# Patient Record
Sex: Female | Born: 1990 | Race: Black or African American | Hispanic: No | Marital: Single | Smoking: Current every day smoker
Health system: Southern US, Community
[De-identification: ages and names within clinical notes are randomized; demographics above are authoritative.]

## PROBLEM LIST (undated history)

## (undated) DIAGNOSIS — Z789 Other specified health status: Secondary | ICD-10-CM

## (undated) HISTORY — PX: CHOLECYSTECTOMY: SHX55

## (undated) HISTORY — DX: Other specified health status: Z78.9

---
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
## 2012-07-16 ENCOUNTER — Emergency Department: Payer: Self-pay | Admitting: Emergency Medicine

## 2012-07-18 ENCOUNTER — Emergency Department: Payer: Self-pay | Admitting: Emergency Medicine

## 2013-02-27 LAB — HM HIV SCREENING LAB: HM HIV Screening: NEGATIVE

## 2014-01-19 ENCOUNTER — Emergency Department: Payer: Self-pay | Admitting: Emergency Medicine

## 2014-01-19 LAB — URINALYSIS, COMPLETE
BACTERIA: NONE SEEN
BILIRUBIN, UR: NEGATIVE
BLOOD: NEGATIVE
GLUCOSE, UR: NEGATIVE mg/dL (ref 0–75)
Leukocyte Esterase: NEGATIVE
NITRITE: NEGATIVE
Ph: 5 (ref 4.5–8.0)
Specific Gravity: 1.024 (ref 1.003–1.030)
WBC UR: 10 /HPF (ref 0–5)

## 2014-06-27 ENCOUNTER — Emergency Department: Payer: Self-pay | Admitting: Emergency Medicine

## 2014-06-27 LAB — COMPREHENSIVE METABOLIC PANEL
ALBUMIN: 3.9 g/dL (ref 3.4–5.0)
ALT: 13 U/L — AB
ANION GAP: 12 (ref 7–16)
AST: 20 U/L (ref 15–37)
Alkaline Phosphatase: 50 U/L
BUN: 10 mg/dL (ref 7–18)
Bilirubin,Total: 1.4 mg/dL — ABNORMAL HIGH (ref 0.2–1.0)
CALCIUM: 9.3 mg/dL (ref 8.5–10.1)
CHLORIDE: 101 mmol/L (ref 98–107)
CO2: 23 mmol/L (ref 21–32)
CREATININE: 0.39 mg/dL — AB (ref 0.60–1.30)
EGFR (African American): >60
EGFR (Non-African Amer.): >60
Glucose: 72 mg/dL (ref 65–99)
Osmolality: 270 (ref 275–301)
Potassium: 3.6 mmol/L (ref 3.5–5.1)
SODIUM: 136 mmol/L (ref 136–145)
Total Protein: 7.5 g/dL (ref 6.4–8.2)

## 2014-06-27 LAB — URINALYSIS, COMPLETE
Bacteria: NONE SEEN
Bilirubin,UR: NEGATIVE
Blood: NEGATIVE
Glucose,UR: NEGATIVE mg/dL (ref 0–75)
Leukocyte Esterase: NEGATIVE
NITRITE: NEGATIVE
Ph: 5 (ref 4.5–8.0)
Protein: 100
RBC,UR: 1 /HPF (ref 0–5)
Specific Gravity: 1.026 (ref 1.003–1.030)
Squamous Epithelial: 16
WBC UR: 6 /HPF (ref 0–5)

## 2014-06-27 LAB — LIPASE, BLOOD: Lipase: 63 U/L — ABNORMAL LOW (ref 73–393)

## 2014-06-27 LAB — CBC
HCT: 39 % (ref 35.0–47.0)
HGB: 13.2 g/dL (ref 12.0–16.0)
MCH: 29 pg (ref 26.0–34.0)
MCHC: 33.8 g/dL (ref 32.0–36.0)
MCV: 86 fL (ref 80–100)
Platelet: 169 10*3/uL (ref 150–440)
RBC: 4.54 10*6/uL (ref 3.80–5.20)
RDW: 12.9 % (ref 11.5–14.5)
WBC: 7.2 10*3/uL (ref 3.6–11.0)

## 2014-06-27 LAB — HCG, QUANTITATIVE, PREGNANCY: Beta Hcg, Quant.: >200000 m[IU]/mL — ABNORMAL HIGH

## 2014-06-27 LAB — WET PREP, GENITAL

## 2014-06-28 LAB — GC/CHLAMYDIA PROBE AMP

## 2014-07-15 LAB — HM PAP SMEAR: HM PAP: NEGATIVE

## 2014-12-23 ENCOUNTER — Encounter: Payer: Self-pay | Admitting: Obstetrics and Gynecology

## 2014-12-30 ENCOUNTER — Encounter: Payer: Self-pay | Admitting: Obstetrics & Gynecology

## 2015-01-06 ENCOUNTER — Encounter: Payer: Self-pay | Admitting: Obstetrics and Gynecology

## 2015-01-13 ENCOUNTER — Encounter: Payer: Self-pay | Admitting: Obstetrics & Gynecology

## 2015-01-14 ENCOUNTER — Inpatient Hospital Stay: Payer: Self-pay

## 2015-04-01 NOTE — H&P (Signed)
L&D Evaluation:  History:  HPI 23yo SBF admitted for IOL secondary to IUGR at 37w, G1P0000, pregnancy complicated by smoking with IUGR diagnosed at 30 weeks, followed by [facility] and or office. last scan EFW <3% at 4#15oz with adequate AFI.   Patient's Medical History No Chronic Illness   Patient's Surgical History none   Medications Pre Natal Vitamins  Tylenol (Acetaminophen)   Allergies NKDA   Social History tobacco   Family History Non-Contributory   ROS:  ROS All systems were reviewed.  HEENT, CNS, GI, GU, Respiratory, CV, Renal and Musculoskeletal systems were found to be normal.   Exam:  Vital Signs stable   General no apparent distress   Mental Status clear   Chest clear   Heart normal sinus rhythm   Abdomen gravid, tender with contractions, rates a 7 on pain scale   Estimated Fetal Weight Small for gestational age   Fetal Position vtx   Edema no edema   Mebranes Intact   FHT normal rate with no decels   Fetal Heart Rate 140   Ucx regular   Ucx Frequency 2 min   Length of each Contraction 50 seconds   Ucx Pain Scale 7   Skin dry   Impression:  Impression IUGR at 37w, IOL   Plan:  Plan EFM/NST, monitor contractions and for cervical change, antibiotics for GBBS prophylaxis, Foley bulb placed and pitocin augmentation planned   Comments 3 doses of cytotec placed per protocol through the night, patient tolerated well, desires epidural when possible; Dr Valentino Saxonherry aware of admission and plan of care, in aggreement   Electronic Signatures: Ulyses AmorBurr, Leahmarie Gasiorowski N (CNM)  (Signed 24-Feb-16 08:02)  Authored: L&D Evaluation   Last Updated: 24-Feb-16 08:02 by Ulyses AmorBurr, Nirvan Laban N (CNM)

## 2015-06-08 IMAGING — US US OB < 14 WEEKS - US OB TV
1 series · 14 of 28 positions shown · non-contrast
Comparison: None.

CLINICAL DATA: Pregnant with pelvic pain

EXAM:
OBSTETRIC <14 WK US AND TRANSVAGINAL OB US
TECHNIQUE: Both transabdominal and transvaginal ultrasound examinations were
performed for complete evaluation of the gestation as well as the
maternal uterus, adnexal regions, and pelvic cul-de-sac.
Transvaginal technique was performed to assess early pregnancy.

[Series 1: us ob < 14 weeks - us ob tv · 0.22mm/px · 14 of 50 slices shown]
[im 2/50]
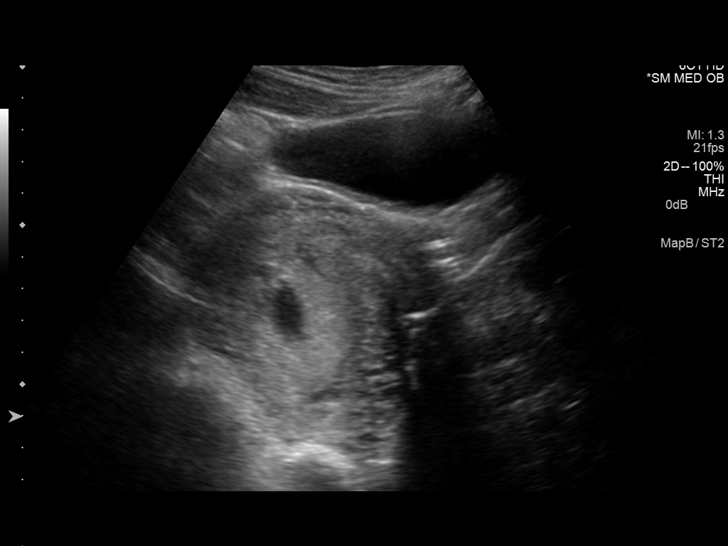
[im 6/50]
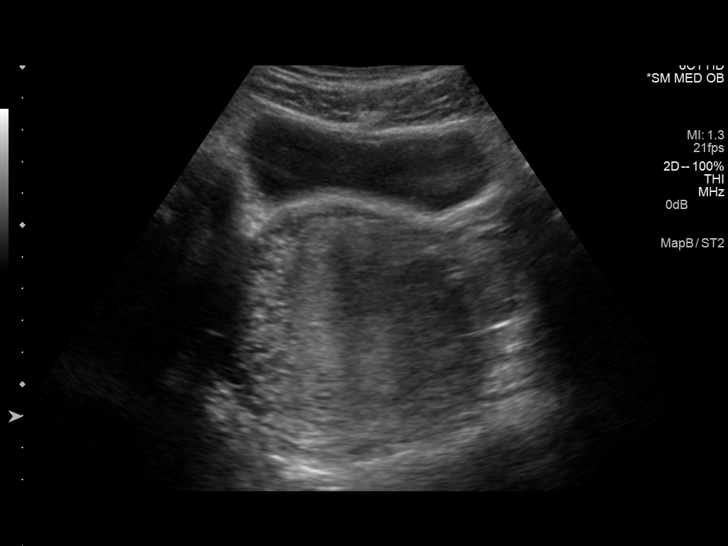
[im 10/50]
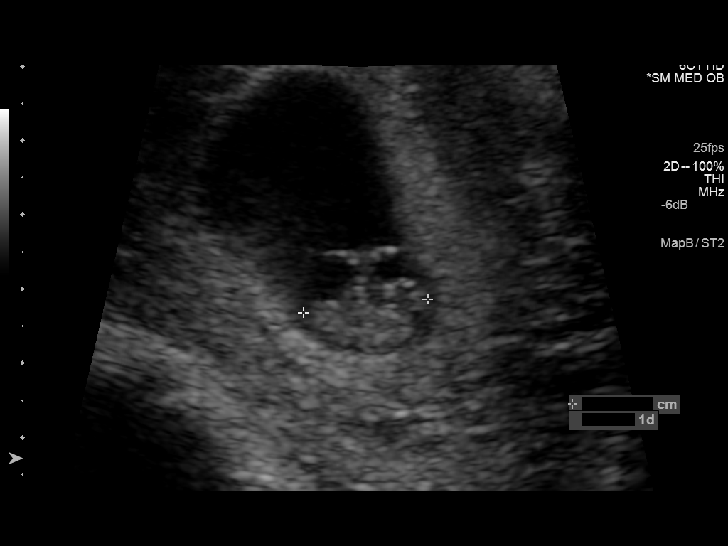
[im 13/50]
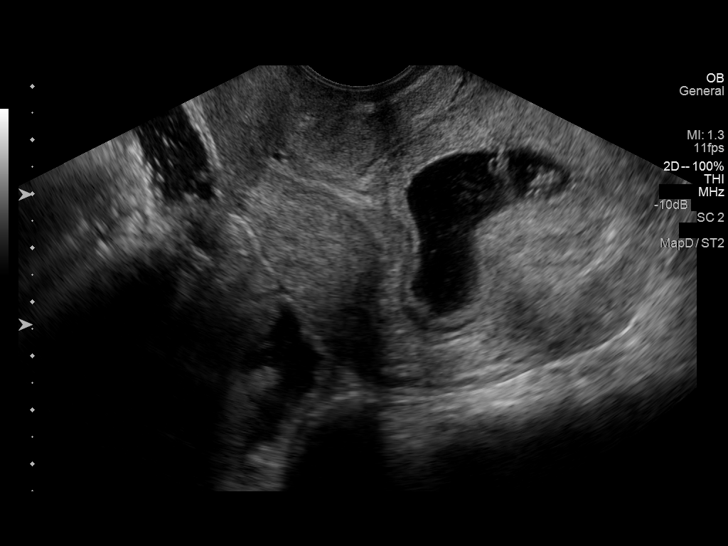
[im 17/50]
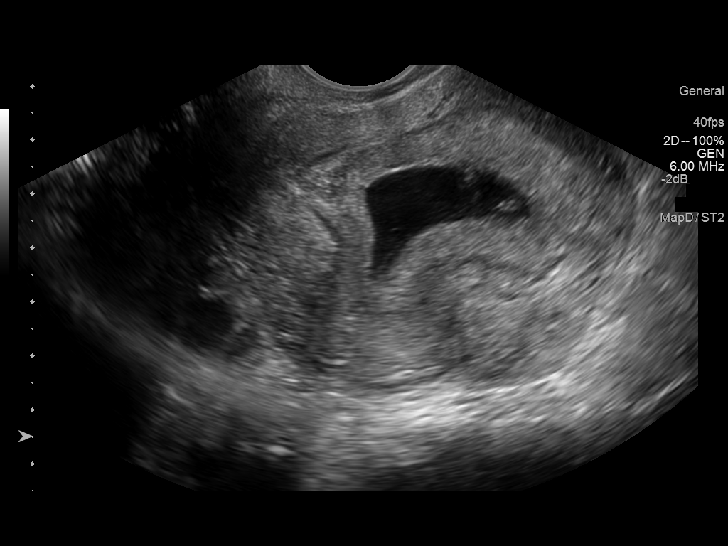
[im 20/50]
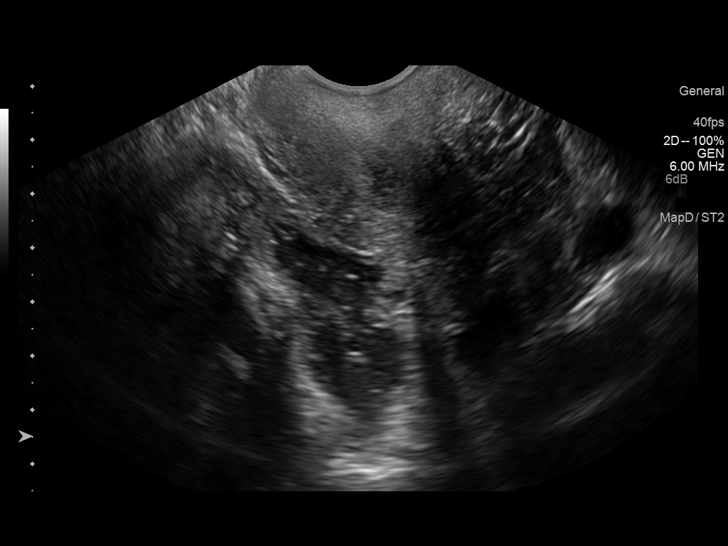
[im 24/50]
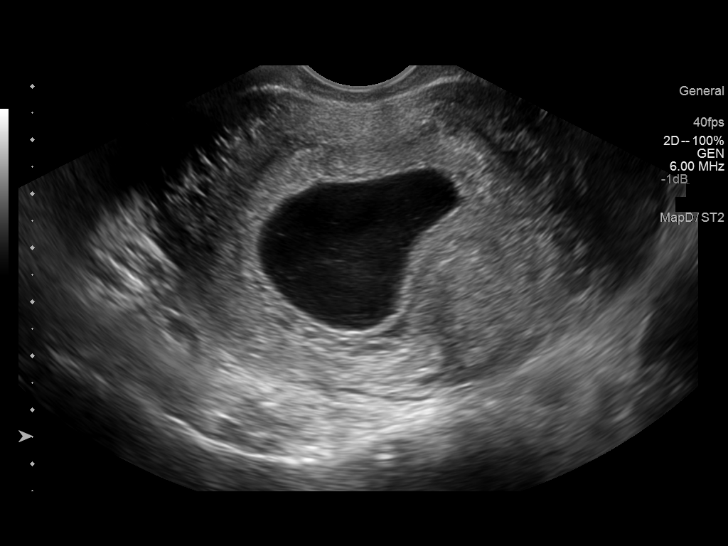
[im 28/50]
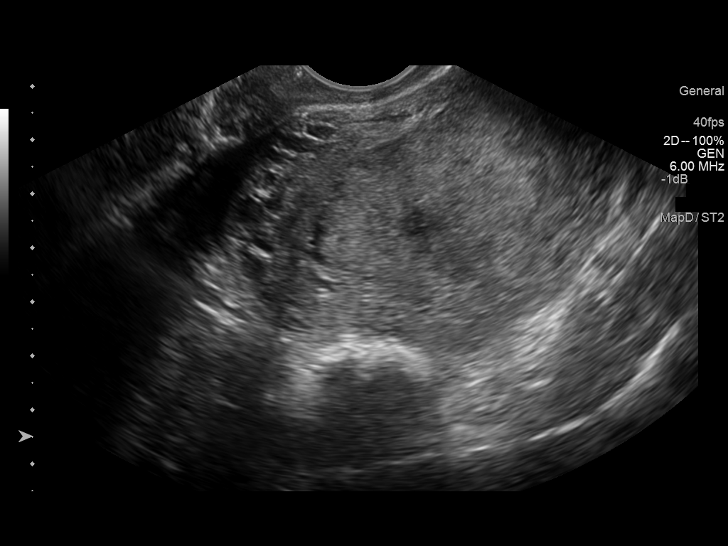
[im 31/50]
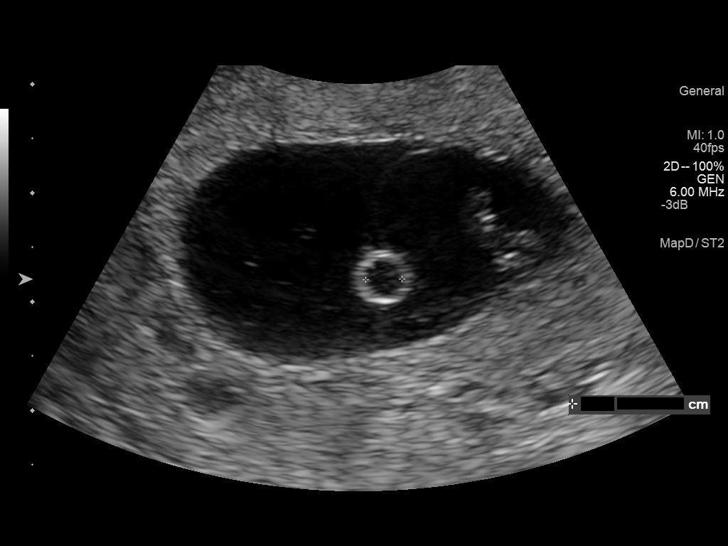
[im 35/50]
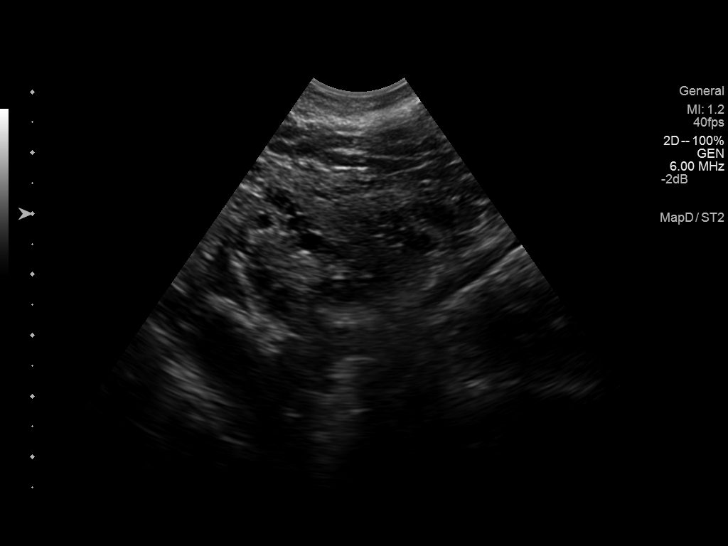
[im 39/50]
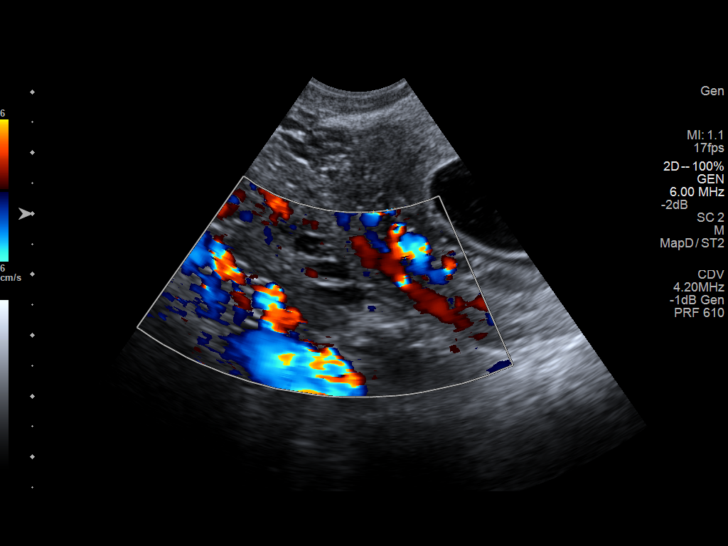
[im 42/50]
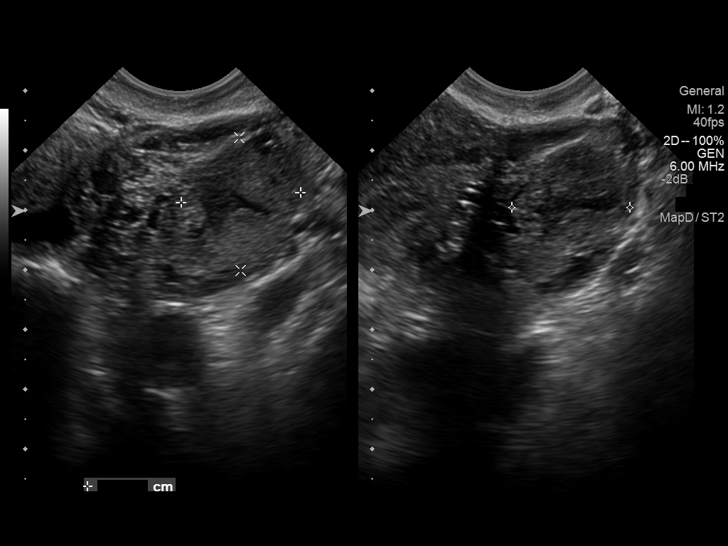
[im 46/50]
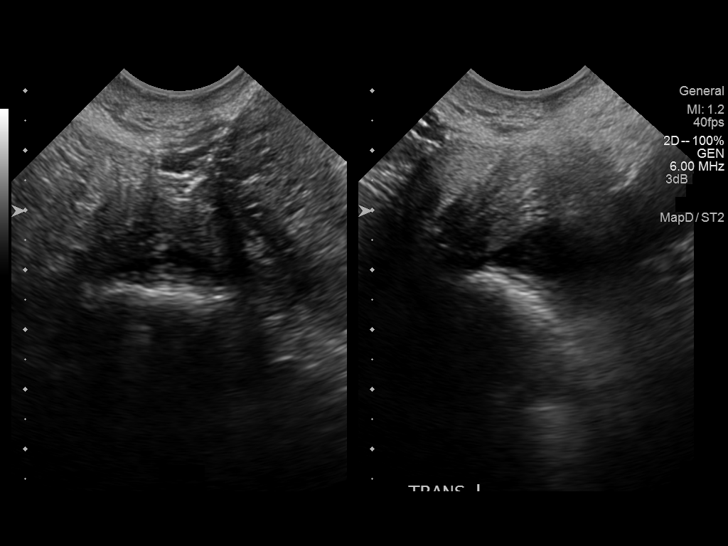
[im 50/50]
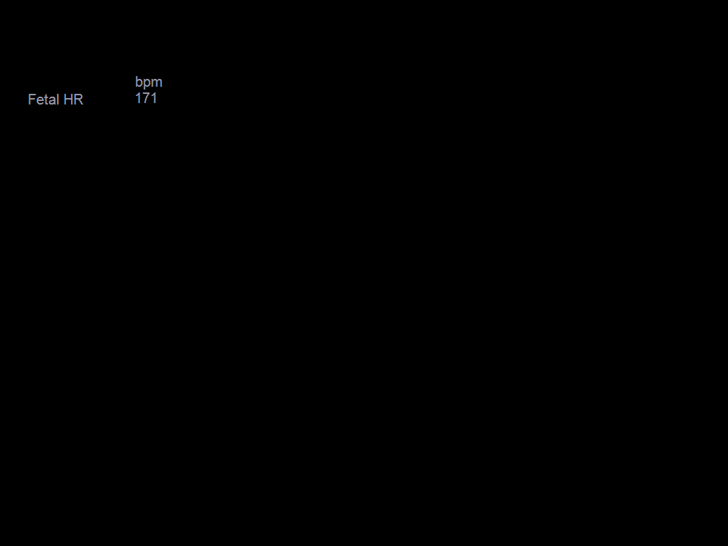

[14 of 28 positions shown; findings below may reference images not displayed]

FINDINGS: Intrauterine gestational sac: Present. There is mild impression on
the posterior sac without visible fibroid or other mass lesion.

Yolk sac:  Present, unremarkable.

Embryo:  Present.

Cardiac Activity: Present.

Heart Rate:  171 bpm

CRL:   16.9  mm   8 w 1 d                  US EDC: 02/04/2014

Maternal uterus/adnexae: Corpus luteum present on the left.
Otherwise the ovaries are unremarkable. No adnexal mass or free
pelvic fluid. No subchorionic hematoma.
IMPRESSION: Single, living intrauterine gestation - 8 weeks 1 day estimated age.
No adverse findings.

## 2015-06-09 ENCOUNTER — Ambulatory Visit: Payer: Self-pay

## 2015-06-09 ENCOUNTER — Encounter: Payer: Self-pay | Admitting: *Deleted

## 2015-06-26 ENCOUNTER — Ambulatory Visit: Payer: Self-pay

## 2015-06-27 ENCOUNTER — Ambulatory Visit: Payer: Self-pay

## 2015-07-04 ENCOUNTER — Ambulatory Visit (INDEPENDENT_AMBULATORY_CARE_PROVIDER_SITE_OTHER): Payer: BLUE CROSS/BLUE SHIELD | Admitting: Obstetrics and Gynecology

## 2015-07-04 VITALS — BP 114/82 | HR 88 | Ht 63.0 in | Wt 131.9 lb

## 2015-07-04 DIAGNOSIS — Z3042 Encounter for surveillance of injectable contraceptive: Secondary | ICD-10-CM

## 2015-07-04 MED ORDER — MEDROXYPROGESTERONE ACETATE 150 MG/ML IM SUSP
150.0000 mg | Freq: Once | INTRAMUSCULAR | Status: AC
  Administered 2015-07-04: 150 mg via INTRAMUSCULAR

## 2015-07-04 NOTE — Progress Notes (Cosign Needed)
Pt is here for depo provera inj Pt is doing well, denies any side effects

## 2015-08-26 ENCOUNTER — Telehealth: Payer: Self-pay | Admitting: Obstetrics and Gynecology

## 2015-08-26 NOTE — Telephone Encounter (Signed)
Patient has been bleeding for a month on depo. She wants to know if she can come in sooner for her next shot.

## 2015-08-27 NOTE — Telephone Encounter (Signed)
Called pt advised she may get depo 1 week earlier

## 2015-09-19 ENCOUNTER — Ambulatory Visit: Payer: BLUE CROSS/BLUE SHIELD

## 2017-07-01 LAB — HM PAP SMEAR: HM Pap smear: NEGATIVE

## 2019-05-23 ENCOUNTER — Emergency Department
Admission: EM | Admit: 2019-05-23 | Discharge: 2019-05-23 | Disposition: A | Discharge status: Home / Self Care | Payer: BC Managed Care – PPO | Attending: Emergency Medicine | Admitting: Emergency Medicine

## 2019-05-23 ENCOUNTER — Encounter: Payer: Self-pay | Admitting: Emergency Medicine

## 2019-05-23 ENCOUNTER — Other Ambulatory Visit: Payer: Self-pay

## 2019-05-23 DIAGNOSIS — N764 Abscess of vulva: Secondary | ICD-10-CM | POA: Diagnosis not present

## 2019-05-23 DIAGNOSIS — N9089 Other specified noninflammatory disorders of vulva and perineum: Secondary | ICD-10-CM | POA: Diagnosis present

## 2019-05-23 DIAGNOSIS — F17200 Nicotine dependence, unspecified, uncomplicated: Secondary | ICD-10-CM | POA: Diagnosis not present

## 2019-05-23 DIAGNOSIS — Z79899 Other long term (current) drug therapy: Secondary | ICD-10-CM | POA: Insufficient documentation

## 2019-05-23 MED ORDER — HYDROCODONE-ACETAMINOPHEN 5-325 MG PO TABS
1.0000 | ORAL_TABLET | Freq: Once | ORAL | Status: AC
  Administered 2019-05-23: 1 via ORAL
  Filled 2019-05-23: qty 1

## 2019-05-23 MED ORDER — SULFAMETHOXAZOLE-TRIMETHOPRIM 800-160 MG PO TABS
1.0000 | ORAL_TABLET | Freq: Once | ORAL | Status: AC
  Administered 2019-05-23: 1 via ORAL
  Filled 2019-05-23: qty 1

## 2019-05-23 MED ORDER — SULFAMETHOXAZOLE-TRIMETHOPRIM 800-160 MG PO TABS: 1.0000 | ORAL_TABLET | Freq: Two times a day (BID) | ORAL | 0 refills | Status: DC

## 2019-05-23 MED ORDER — LIDOCAINE HCL (PF) 1 % IJ SOLN
5.0000 mL | Freq: Once | INTRAMUSCULAR | Status: AC
  Administered 2019-05-23: 5 mL via INTRADERMAL
  Filled 2019-05-23: qty 5

## 2019-05-23 NOTE — ED Triage Notes (Signed)
States she thinks she has an abscess to right groin area

## 2019-05-23 NOTE — ED Provider Notes (Signed)
[facility] Emergency Department Provider Note  ____________________________________________  Time seen: Approximately 11:06 AM  I have reviewed the triage vital signs and the nursing notes.   HISTORY  Chief Complaint Abscess    HPI Michelle Meadows is a 28 y.o. female that presents to the emergency department for evaluation of abscess to right labia for several days.  Patient has gotten frequent abscesses in the past.  No fevers.    History reviewed. No pertinent past medical history.  There are no active problems to display for this patient.   History reviewed. No pertinent surgical history.  Prior to Admission medications   Medication Sig Start Date End Date Taking? Authorizing Provider  medroxyPROGESTERone (DEPO-PROVERA) 150 MG/ML injection Inject 150 mg into the muscle every 3 (three) months.    [provider]  sulfamethoxazole-trimethoprim (BACTRIM DS) 800-160 MG tablet Take 1 tablet by mouth 2 (two) times daily. 05/23/19   Laban Emperor, PA-C    Allergies Patient has no known allergies.  Family History  Problem Relation Age of Onset  . Diabetes Mother   . Heart disease Maternal Grandfather     Social History Social History   Tobacco Use  . Smoking status: Current Every Day Smoker  . Smokeless tobacco: Never Used  Substance Use Topics  . Alcohol use: No  . Drug use: No     Review of Systems  Constitutional: No fever/chills Cardiovascular: No chest pain. Respiratory: No SOB. Gastrointestinal: No abdominal pain.  No nausea, no vomiting.  Musculoskeletal: Negative for musculoskeletal pain. Skin: Negative for rash, abrasions, lacerations, ecchymosis.   ____________________________________________   PHYSICAL EXAM:  VITAL SIGNS: ED Triage Vitals  Enc Vitals Group     BP 05/23/19 1036 122/70     Pulse Rate 05/23/19 1036 80     Resp 05/23/19 1036 18     Temp 05/23/19 1036 98 F (36.7 C)     Temp Source  05/23/19 1036 Oral     SpO2 05/23/19 1036 99 %     Weight 05/23/19 1033 150 lb (68 kg)     Height 05/23/19 1033 5\' 2"  (1.575 m)     Head Circumference --      Peak Flow --      Pain Score --      Pain Loc --      Pain Edu? --      Excl. in [place]? --      Constitutional: Alert and oriented. Well appearing and in no acute distress. Eyes: Conjunctivae are normal. PERRL. EOMI. Head: Atraumatic. ENT:      Ears:      Nose: No congestion/rhinnorhea.      Mouth/Throat: Mucous membranes are moist.  Neck: No stridor.  Cardiovascular: Normal rate, regular rhythm.  Good peripheral circulation. Respiratory: Normal respiratory effort without tachypnea or retractions. Lungs CTAB. Good air entry to the bases with no decreased or absent breath sounds. Musculoskeletal: Full range of motion to all extremities. No gross deformities appreciated.  1 cm x 1 cm area of tenderness and fluctuance to right labia. Neurologic:  Normal speech and language. No gross focal neurologic deficits are appreciated.  Skin:  Skin is warm, dry and intact. No rash noted. Psychiatric: Mood and affect are normal. Speech and behavior are normal. Patient exhibits appropriate insight and judgement.   ____________________________________________   LABS (all labs ordered are listed, but only abnormal results are displayed)  Labs Reviewed - No data to display ____________________________________________  EKG   ____________________________________________  RADIOLOGY  No results found.   ____________________________________________    PROCEDURES  Procedure(s) performed:    Procedures  INCISION AND DRAINAGE Performed by: Enid DerryAshley Lurlean Kernen Consent: Verbal consent obtained. Risks and benefits: risks, benefits and alternatives were discussed Type: abscess  Body area: labia  Anesthesia: local infiltration  Incision was made with a scalpel.  Local anesthetic: lidocaine 1 % without epinephrine  Anesthetic  total: 3  ml  Complexity: complex Blunt dissection to break up loculations  Drainage: purulent  Drainage amount: moderate  Packing material: 1/4 in iodoform gauze  Patient tolerance: Patient tolerated the procedure well with no immediate complications.    Medications  lidocaine (PF) (XYLOCAINE) 1 % injection 5 mL (5 mLs Intradermal Given 05/23/19 1120)  sulfamethoxazole-trimethoprim (BACTRIM DS) 800-160 MG per tablet 1 tablet (1 tablet Oral Given 05/23/19 1121)  HYDROcodone-acetaminophen (NORCO/VICODIN) 5-325 MG per tablet 1 tablet (1 tablet Oral Given 05/23/19 1129)     ____________________________________________   INITIAL IMPRESSION / ASSESSMENT AND PLAN / ED COURSE  Pertinent labs & imaging results that were available during my care of the patient were reviewed by me and considered in my medical decision making (see chart for details).  Review of the [place] CSRS was performed in accordance of the [facility] prior to dispensing any controlled drugs.     Patient's diagnosis is consistent with labial abscess.  Vital signs and exam are reassuring.  Abscess was drained and packed in the emergency department.  Patient will return for packing removal in 2 days.  Dose of Bactrim was given in the emergency department.  Patient will be discharged home with prescriptions for Bactrim. Patient is to follow up with primary care as directed. Patient is given ED precautions to return to the ED for any worsening or new symptoms.  Michelle Meadows was evaluated in Emergency Department on 05/23/2019 for the symptoms described in the history of present illness. She was evaluated in the context of the global COVID-19 pandemic, which necessitated consideration that the patient might be at risk for infection with the SARS-CoV-2 virus that causes COVID-19. Institutional protocols and algorithms that pertain to the evaluation of patients at risk for COVID-19 are in a state of rapid change based on information  released by regulatory bodies including the CDC and federal and state organizations. These policies and algorithms were followed during the patient's care in the ED.   ____________________________________________  FINAL CLINICAL IMPRESSION(S) / ED DIAGNOSES  Final diagnoses:  Labial abscess      NEW MEDICATIONS STARTED DURING THIS VISIT:  ED Discharge Orders         Ordered    sulfamethoxazole-trimethoprim (BACTRIM DS) 800-160 MG tablet  2 times daily     05/23/19 1217              This chart was dictated using voice recognition software/Dragon. Despite best efforts to proofread, errors can occur which can change the meaning. Any change was purely unintentional.    Enid DerryWagner, Jemia Fata, PA-C 05/23/19 1312    Shaune PollackIsaacs, Cameron, MD 05/23/19 2010

## 2019-06-08 ENCOUNTER — Ambulatory Visit (LOCAL_COMMUNITY_HEALTH_CENTER): Payer: BC Managed Care – PPO

## 2019-06-08 ENCOUNTER — Other Ambulatory Visit: Payer: Self-pay

## 2019-06-08 VITALS — BP 109/75 | Wt 150.0 lb

## 2019-06-08 DIAGNOSIS — Z3042 Encounter for surveillance of injectable contraceptive: Secondary | ICD-10-CM

## 2019-06-08 DIAGNOSIS — Z30013 Encounter for initial prescription of injectable contraceptive: Secondary | ICD-10-CM

## 2019-06-08 DIAGNOSIS — Z3009 Encounter for other general counseling and advice on contraception: Secondary | ICD-10-CM

## 2019-06-08 MED ORDER — MEDROXYPROGESTERONE ACETATE 150 MG/ML IM SUSP
150.0000 mg | Freq: Once | INTRAMUSCULAR | Status: AC
  Administered 2019-06-08: 16:00:00 150 mg via INTRAMUSCULAR

## 2019-06-08 NOTE — Progress Notes (Signed)
Consult with Criss Rosales PA d/t patient being at 17 weeks and 2 days today with Depo. Per C. Hampton PA VO offer patient ECP and Depo. Patient declined ECP; instructed patient to use condoms at least 2 weeks and take home PT.  If +PT call [facility]. Patient verbalized understanding Aileen Fass, RN

## 2019-08-31 ENCOUNTER — Encounter: Payer: Self-pay | Admitting: Physician Assistant

## 2019-08-31 ENCOUNTER — Ambulatory Visit (LOCAL_COMMUNITY_HEALTH_CENTER): Payer: BC Managed Care – PPO | Admitting: Physician Assistant

## 2019-08-31 ENCOUNTER — Other Ambulatory Visit: Payer: Self-pay

## 2019-08-31 ENCOUNTER — Ambulatory Visit: Payer: BC Managed Care – PPO

## 2019-08-31 VITALS — BP 111/70 | Ht 64.0 in | Wt 152.0 lb

## 2019-08-31 DIAGNOSIS — Z3042 Encounter for surveillance of injectable contraceptive: Secondary | ICD-10-CM

## 2019-08-31 DIAGNOSIS — Z3009 Encounter for other general counseling and advice on contraception: Secondary | ICD-10-CM | POA: Diagnosis not present

## 2019-08-31 DIAGNOSIS — Z30013 Encounter for initial prescription of injectable contraceptive: Secondary | ICD-10-CM

## 2019-08-31 DIAGNOSIS — Z113 Encounter for screening for infections with a predominantly sexual mode of transmission: Secondary | ICD-10-CM

## 2019-08-31 DIAGNOSIS — Z299 Encounter for prophylactic measures, unspecified: Secondary | ICD-10-CM

## 2019-08-31 LAB — WET PREP FOR TRICH, YEAST, CLUE
Trichomonas Exam: NEGATIVE
Yeast Exam: NEGATIVE

## 2019-08-31 LAB — PREGNANCY, URINE: Preg Test, Ur: NEGATIVE

## 2019-08-31 MED ORDER — ACYCLOVIR 400 MG PO TABS: 400.0000 mg | ORAL_TABLET | Freq: Three times a day (TID) | ORAL | 0 refills | Status: AC

## 2019-08-31 MED ORDER — MEDROXYPROGESTERONE ACETATE 150 MG/ML IM SUSP
150.0000 mg | INTRAMUSCULAR | Status: AC
  Administered 2019-08-31: 150 mg via INTRAMUSCULAR

## 2019-08-31 NOTE — Progress Notes (Signed)
UPT is negative today. Wet mount reviewed and is negative, no treatment needed per standing order. Pt received Depo 150mg  IM today per provider order and pt tolerated well. Pt also received Acyclovir 400mg  #30 per provider orders. Provider orders completed.Ronny Bacon, RN

## 2019-08-31 NOTE — Progress Notes (Signed)
Pt here for Depo and STD screening. Pt declines blood work for HIV and syphillis. Last Depo was 06/08/2019, 12 weeks post last Depo. Pt was late for her last Depo and was supposed to do home UPT 2 weeks post her Depo on 06/08/2019 but pt reports she did not check a home UPT. Last PE 07/2018.Ronny Bacon, RN

## 2019-09-01 NOTE — Progress Notes (Signed)
[facility] problem visit  [facility]  Subjective:  Michelle Meadows is a 28 y.o. being seen today to get Depo and desires STD screening due to "bumps".  Chief Complaint  Patient presents with  . Contraception    desires Depo  . SEXUALLY TRANSMITTED DISEASE    STD screening    HPI patient reports that she noticed "bumps" in the genital area for about 1 week.  Denies itching, pain, open sores and thinks that she may have HPV since she has had this in the past.  Also, needs PT and if negative her Depo is due today.  Reports that she has noticed a vaginal odor off an on for a few days.  Denies excess discharge,dysuria and pain with sex.   Does the patient have a current or past history of drug use? No   No components found for: HCV]   Health Maintenance Due  Topic Date Due  . TETANUS/TDAP  08/30/2011  . INFLUENZA VACCINE  06/23/2019    Review of Systems  All other systems reviewed and are negative.   The following portions of the patient's history were reviewed and updated as appropriate: allergies, current medications, past family history, past medical history, past social history, past surgical history and problem list. Problem list updated.   See flowsheet for other program required questions.  Objective:   Vitals:   08/31/19 1027  BP: 111/70  Weight: 152 lb (68.9 kg)  Height: 5\' 4"  (1.626 m)    Physical Exam Vitals signs reviewed.  Constitutional:      General: She is not in acute distress.    Appearance: Normal appearance.  HENT:     Head: Normocephalic and atraumatic.     Mouth/Throat:     Mouth: Mucous membranes are moist.     Pharynx: Oropharynx is clear. No oropharyngeal exudate or posterior oropharyngeal erythema.  Eyes:     Conjunctiva/sclera: Conjunctivae normal.  Neck:     Musculoskeletal: Neck supple.  Pulmonary:     Effort: Pulmonary effort is normal.  Abdominal:     Palpations: There is no mass.     Tenderness: There is no abdominal tenderness. There is no guarding or rebound.  Genitourinary:    General: Normal vulva.     Rectum: Normal.     Comments: External genitalia/pubic area without nits, lice, edema, erythema. No inguinal adenopathy on the Right side but ~1.5 cm swollen lymph node on L side in inguinal area. Vagina with normal mucosa and no discharge. Cervix without visible lesions. Uterus firm, mobile, nt, no masses, no CMT, no adnexal tenderness or fullness. L side of perineal area and L buttock with partially healed hypopigmented area with small amount of exudate which opens up to more ulcerative appearance when swab touched to area for HSV culture, but is non-tender. Lymphadenopathy:     Cervical: No cervical adenopathy.  Skin:    General: Skin is warm and dry.     Findings: No bruising, erythema, lesion or rash.  Neurological:     Mental Status: She is alert.  Psychiatric:        Mood and Affect: Mood normal.        Behavior: Behavior normal.        Thought Content: Thought content normal.        Judgment: Judgment normal.       Assessment and Plan:  Michelle Meadows is a 28 y.o. female presenting to the [facility] Department  for a Women's Health problem visit  1. Encounter for counseling regarding contraception Counseled that we need to have a negative pregnancy test before we can give her Depo today. Rec condoms with all sex.  2. Screening for STD (sexually transmitted disease) Patient requests screening and declines blood work today. Counseled patient re:  HSV briefly and offered treatment due to this being first time she has had any lesion of this type.  Patient accepts Acyclovir 400 mg 1 po TID for 10 days. Await test results.  Counseled that RN will call if needs to RTC for any further treatment once results are back. - WET PREP FOR TRICH, YEAST, CLUE - Chlamydia/Gonorrhea [facility] - acyclovir (ZOVIRAX) 400 MG tablet; Take 1 tablet  (400 mg total) by mouth 3 (three) times daily for 10 days.  Dispense: 30 tablet; Refill: 0  3. Surveillance for Depo-Provera contraception If pregnancy test is negative, OK to give Depo 150mg  IM today and then again in 11-13 weeks. Will need RP with the next 1-2 Depo shots depending on availability of RP appts.  - Pregnancy, urine - medroxyPROGESTERone (DEPO-PROVERA) injection 150 mg     No follow-ups on file.  No future appointments.  Jerene Dilling, PA

## 2019-09-11 ENCOUNTER — Other Ambulatory Visit: Payer: Self-pay

## 2019-09-11 ENCOUNTER — Telehealth: Payer: Self-pay

## 2019-09-11 DIAGNOSIS — B009 Herpesviral infection, unspecified: Secondary | ICD-10-CM | POA: Insufficient documentation

## 2019-09-11 NOTE — Telephone Encounter (Signed)
Acyclovir 800mg  1 po daily #31, refills x 1 year called into [facility]. See phone note. Aileen Fass, RN

## 2019-09-11 NOTE — Telephone Encounter (Signed)
Acyclovir 800mg  1 po daily #31, refills x 1 year called into [facility] per C. Morrow PA VO Aileen Fass, RN

## 2019-09-11 NOTE — Telephone Encounter (Signed)
Consulted by RN re:  Patient request for HSV suppressive treatment.  OKed that Acyclovir 800mg  #31 1 po QD with refills for 1 year be called in for patient.  Documentation reviewed and agree with note by RN.

## 2019-09-11 NOTE — Telephone Encounter (Signed)
TC to patient. Verified ID via password/SS#. Informed of positive HSV2. Discussed treatment regimens and HSV education. Patient would like Acyclovir daily.

## 2019-09-12 NOTE — Addendum Note (Signed)
Addended by: Antoine Primas on: 09/12/2019 10:06 AM   Modules accepted: Orders

## 2019-10-04 NOTE — Addendum Note (Signed)
Addended by: Cletis Media on: 10/04/2019 08:50 AM   Modules accepted: Orders

## 2019-12-07 ENCOUNTER — Ambulatory Visit (LOCAL_COMMUNITY_HEALTH_CENTER): Payer: BC Managed Care – PPO

## 2019-12-07 ENCOUNTER — Other Ambulatory Visit: Payer: Self-pay

## 2019-12-07 VITALS — BP 114/72 | Ht 64.0 in | Wt 155.0 lb

## 2019-12-07 DIAGNOSIS — Z30013 Encounter for initial prescription of injectable contraceptive: Secondary | ICD-10-CM

## 2019-12-07 DIAGNOSIS — Z3042 Encounter for surveillance of injectable contraceptive: Secondary | ICD-10-CM

## 2019-12-07 DIAGNOSIS — Z3009 Encounter for other general counseling and advice on contraception: Secondary | ICD-10-CM

## 2019-12-07 MED ORDER — MEDROXYPROGESTERONE ACETATE 150 MG/ML IM SUSP
150.0000 mg | Freq: Once | INTRAMUSCULAR | Status: AC
  Administered 2019-12-07: 150 mg via INTRAMUSCULAR

## 2019-12-07 NOTE — Progress Notes (Signed)
14.0 weeks post depo today. DMPA 150 mg IM administered today per Sadie Haber, PA order dated 08/31/2019. Pt to schedule physical/provider visit when next depo is due.

## 2020-03-19 ENCOUNTER — Other Ambulatory Visit: Payer: Self-pay

## 2020-03-19 ENCOUNTER — Ambulatory Visit (LOCAL_COMMUNITY_HEALTH_CENTER): Payer: BC Managed Care – PPO | Admitting: Advanced Practice Midwife

## 2020-03-19 ENCOUNTER — Encounter: Payer: Self-pay | Admitting: Advanced Practice Midwife

## 2020-03-19 VITALS — BP 102/66 | Ht 62.0 in | Wt 152.2 lb

## 2020-03-19 DIAGNOSIS — Z3009 Encounter for other general counseling and advice on contraception: Secondary | ICD-10-CM | POA: Diagnosis not present

## 2020-03-19 DIAGNOSIS — E663 Overweight: Secondary | ICD-10-CM | POA: Insufficient documentation

## 2020-03-19 DIAGNOSIS — Z30013 Encounter for initial prescription of injectable contraceptive: Secondary | ICD-10-CM | POA: Diagnosis not present

## 2020-03-19 DIAGNOSIS — Z3042 Encounter for surveillance of injectable contraceptive: Secondary | ICD-10-CM

## 2020-03-19 DIAGNOSIS — F17200 Nicotine dependence, unspecified, uncomplicated: Secondary | ICD-10-CM

## 2020-03-19 MED ORDER — MEDROXYPROGESTERONE ACETATE 150 MG/ML IM SUSP
150.0000 mg | Freq: Once | INTRAMUSCULAR | Status: AC
  Administered 2020-03-19: 16:00:00 150 mg via INTRAMUSCULAR

## 2020-03-19 NOTE — Progress Notes (Signed)
   [facility] problem [facility]  Subjective:  Michelle Meadows is a 29 y.o. SBF G1P1 smoker being seen today for DMPA  Chief Complaint  Patient presents with  . Contraception    Depo    HPI Last DMPA 12/07/19.  Last Pap 07/01/17 neg. LMP ?Marland Kitchen  Last sex 03/14/20 without condom.  Last PE 07/2018.  Does the patient have a current or past history of drug use? No   No components found for: HCV]   Health Maintenance Due  Topic Date Due  . COVID-19 Vaccine (1) Never done  . TETANUS/TDAP  Never done    ROS  The following portions of the patient's history were reviewed and updated as appropriate: allergies, current medications, past family history, past medical history, past social history, past surgical history and problem list. Problem list updated.   See flowsheet for other program required questions.  Objective:   Vitals:   03/19/20 1514  BP: 102/66  Weight: 152 lb 3.2 oz (69 kg)  Height: 5\' 2"  (1.575 m)    Physical Exam Constitutional:      Appearance: Normal appearance.  HENT:     Head: Normocephalic.  Eyes:     Conjunctiva/sclera: Conjunctivae normal.  Pulmonary:     Effort: Pulmonary effort is normal.  Abdominal:     Palpations: Abdomen is soft.  Genitourinary:    General: Normal vulva.     Exam position: Lithotomy position.     Vagina: Vaginal discharge (white creamy leukorrhea) present.     Cervix: Normal.     Uterus: Normal.      Adnexa: Right adnexa normal and left adnexa normal.     Rectum: Normal.     Comments: Pap done Skin:    General: Skin is warm and dry.  Neurological:     Mental Status: She is alert.       Assessment and Plan:  Michelle Meadows is a 29 y.o. female presenting to the [facility] Department for a Women's Health problem visit  1. Family planning Happy with DMPA and wants more  2. Encounter for surveillance of injectable contraceptive May have DMPA 150 mg IM q  11-13 wks x 1 year  3. Smoker 3/4 ppd Counseled to stop  4. Overweight      No follow-ups on file.  No future appointments.  June, CNM

## 2020-03-19 NOTE — Progress Notes (Signed)
Depo given right glute, tolerated well, next Depo reminder card given.Burt Knack, RN

## 2020-03-19 NOTE — Progress Notes (Signed)
Patient here for Depo at 14 5/7 since last Depo. Last PE 07/2018, CBE due 2020, Last Pap test 07/01/2017, negative.. Needs PE update and new Depo order.Burt Knack, RN

## 2020-03-31 LAB — IGP, RFX APTIMA HPV ASCU: PAP Smear Comment: 0

## 2020-03-31 LAB — HPV APTIMA: HPV Aptima: NEGATIVE

## 2020-04-01 ENCOUNTER — Encounter: Payer: Self-pay | Admitting: Advanced Practice Midwife

## 2020-04-01 DIAGNOSIS — R87619 Unspecified abnormal cytological findings in specimens from cervix uteri: Secondary | ICD-10-CM | POA: Insufficient documentation

## 2020-05-26 ENCOUNTER — Encounter: Payer: Self-pay | Admitting: Emergency Medicine

## 2020-05-26 ENCOUNTER — Emergency Department
Admission: EM | Admit: 2020-05-26 | Discharge: 2020-05-26 | Disposition: A | Discharge status: Home / Self Care | Payer: BC Managed Care – PPO | Attending: Emergency Medicine | Admitting: Emergency Medicine

## 2020-05-26 ENCOUNTER — Emergency Department: Payer: BC Managed Care – PPO

## 2020-05-26 ENCOUNTER — Other Ambulatory Visit: Payer: Self-pay

## 2020-05-26 DIAGNOSIS — M25512 Pain in left shoulder: Secondary | ICD-10-CM | POA: Insufficient documentation

## 2020-05-26 DIAGNOSIS — Z79899 Other long term (current) drug therapy: Secondary | ICD-10-CM | POA: Insufficient documentation

## 2020-05-26 DIAGNOSIS — F17210 Nicotine dependence, cigarettes, uncomplicated: Secondary | ICD-10-CM | POA: Diagnosis not present

## 2020-05-26 NOTE — ED Triage Notes (Signed)
Patient presents to the ED with left shoulder pain since last night.  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  Patient states last night was worse than normal and patient reports difficulty sleeping.

## 2020-05-26 NOTE — Discharge Instructions (Signed)
Your shoulder x-ray is normal.  I suspect that you have inflammation to the rotator cuff muscles.  Please call orthopedics for a follow-up appointment.

## 2020-05-26 NOTE — ED Notes (Signed)
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

## 2020-05-26 NOTE — ED Provider Notes (Signed)
[facility] Emergency Department Provider Note  ____________________________________________  Time seen: Approximately 1:49 PM  I have reviewed the triage vital signs and the nursing notes.   HISTORY  Chief Complaint Shoulder Pain    HPI Michelle Meadows is a 29 y.o. female that presents to the emergency department for evaluation of acute on chronic left shoulder pain worsening since last night.  Patient states that this has been an ongoing problem.  Occasionally she also has pain to her right shoulder.  She thought maybe she slept on it wrong last night which made it worse. 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  She thinks her job is making her symptoms worse.  No shortness breath, chest pain, forearm or hand pain.   History reviewed. No pertinent past medical history.  Patient Active Problem List   Diagnosis Date Noted  . Abnormal Pap smear of cervix ASCUS HPV neg 03/19/20 04/01/2020  . Smoker 3/4 ppd 03/19/2020  . Overweight 03/19/2020  . 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 CHOLECYSTECTOMY      Prior to Admission medications   Medication Sig Start Date End Date Taking? Authorizing Provider  medroxyPROGESTERone (DEPO-PROVERA) 150 MG/ML injection Inject 150 mg into the muscle every 3 (three) months.    [provider]    Allergies Patient has no known allergies.  Family History  Problem Relation Age of Onset  . Diabetes Mother   . Hypertension Mother   . Heart disease Maternal Grandfather   . Hypertension Maternal Grandfather   . Sickle cell trait Sister   . Diabetes Sister   . Bone cancer Maternal Grandmother   . Lupus Maternal Grandmother     Social History Social History   Tobacco Use  . Smoking status: Current Every Day Smoker    Packs/day: 0.50    Years: 8.00    Pack years: 4.00  . Smokeless tobacco: Never Used  Substance Use Topics  . Alcohol  use: No  . Drug use: No     Review of Systems  Constitutional: No fever/chills ENT: No upper respiratory complaints. Cardiovascular: No chest pain. Respiratory: No SOB. Gastrointestinal: No abdominal pain.  No nausea, no vomiting.  Musculoskeletal: Positive for shoulder pain. Skin: Negative for rash, abrasions, lacerations, ecchymosis. Neurological: Negative for headaches, numbness or tingling   ____________________________________________   PHYSICAL EXAM:  VITAL SIGNS: ED Triage Vitals  Enc Vitals Group     BP 05/26/20 1222 116/67     Pulse Rate 05/26/20 1222 77     Resp 05/26/20 1222 18     Temp 05/26/20 1222 99 F (37.2 C)     Temp Source 05/26/20 1222 Oral     SpO2 05/26/20 1222 100 %     Weight 05/26/20 1208 150 lb (68 kg)     Height 05/26/20 1208 5\' 2"  (1.575 m)     Head Circumference --      Peak Flow --      Pain Score 05/26/20 1207 5     Pain Loc --      Pain Edu? --      Excl. in GC? --      Constitutional: Alert and oriented. Well appearing and in no acute distress. Eyes: Conjunctivae are normal. PERRL. EOMI. Head: Atraumatic. ENT:      Ears:      Nose: No congestion/rhinnorhea.      Mouth/Throat: Mucous membranes are moist.  Neck: No stridor.  No cervical spine tenderness to palpation. Cardiovascular: Normal rate, regular rhythm.  Good peripheral circulation.  Symmetric radial pulses bilaterally. Respiratory: Normal respiratory effort without tachypnea or retractions. Lungs CTAB. Good air entry to the bases with no decreased or absent breath sounds. Gastrointestinal: Bowel sounds 4 quadrants. Soft and nontender to palpation. No guarding or rigidity. No palpable masses. No distention.  Musculoskeletal: Full range of motion to all extremities. No gross deformities appreciated.  Limited abduction of left shoulder due to pain.  Normal flexion and extension of left shoulder. Neurologic:  Normal speech and language. No gross focal neurologic deficits are  appreciated.  Skin:  Skin is warm, dry and intact. No rash noted. Psychiatric: Mood and affect are normal. Speech and behavior are normal. Patient exhibits appropriate insight and judgement.   ____________________________________________   LABS (all labs ordered are listed, but only abnormal results are displayed)  Labs Reviewed - No data to display ____________________________________________  EKG   ____________________________________________  RADIOLOGY Lexine Baton, personally viewed and evaluated these images (plain radiographs) as part of my medical decision making, as well as reviewing the written report by the radiologist.  DG Shoulder Left  Result Date: 05/26/2020 CLINICAL DATA:  Shoulder pain EXAM: LEFT SHOULDER - 2+ VIEW COMPARISON:  None. FINDINGS: There is no evidence of fracture or dislocation. There is no evidence of arthropathy or other focal bone abnormality. Soft tissues are unremarkable. IMPRESSION: Negative. Electronically Signed   By: Romona Curls M.D.   On: 05/26/2020 14:45    ____________________________________________    PROCEDURES  Procedure(s) performed:    Procedures    Medications - No data to display   ____________________________________________   INITIAL IMPRESSION / ASSESSMENT AND PLAN / ED COURSE  Pertinent labs & imaging results that were available during my care of the patient were reviewed by me and considered in my medical decision making (see chart for details).  Review of the [place] CSRS was performed in accordance of the [facility] prior to dispensing any controlled drugs.     Patient presented to emergency department for evaluation of acute on chronic left shoulder pain.  Vital signs and exam are reassuring.  X-ray is negative for acute abnormalities.  I suspect the patient has inflammation to her rotator cuff.  Patient Clines IM Toradol.  Patient is to follow up with orthopedics as directed. Patient is given ED precautions to  return to the ED for any worsening or new symptoms.  Michelle Meadows was evaluated in Emergency Department on 05/26/2020 for the symptoms described in the history of present illness. She was evaluated in the context of the global COVID-19 pandemic, which necessitated consideration that the patient might be at risk for infection with the SARS-CoV-2 virus that causes COVID-19. Institutional protocols and algorithms that pertain to the evaluation of patients at risk for COVID-19 are in a state of rapid change based on information released by regulatory bodies including the CDC and federal and state organizations. These policies and algorithms were followed during the patient's care in the ED.   ____________________________________________  FINAL CLINICAL IMPRESSION(S) / ED DIAGNOSES  Final diagnoses:  Acute pain of left shoulder      NEW MEDICATIONS STARTED DURING THIS VISIT:  ED Discharge Orders    None          This chart was dictated using voice recognition software/Dragon. Despite best efforts to proofread, errors can occur which can change the meaning. Any change was purely unintentional.  Enid Derry, PA-C 05/26/20 1532    Arnaldo Natal, MD 05/26/20 [phone]

## 2020-07-01 ENCOUNTER — Telehealth: Payer: Self-pay

## 2020-07-01 NOTE — Telephone Encounter (Signed)
Telephone call to patient today.  Patient scheduled for a repeat PAP, physical and she also desires her Depo.  Appointment scheduled for 07/07/2020 at 4pm (arrival time 3:30pm).  Andrew Au assisted with scheduling appointment in Epic. Hart Carwin, RN

## 2020-07-02 ENCOUNTER — Ambulatory Visit: Payer: BC Managed Care – PPO

## 2020-07-07 ENCOUNTER — Telehealth: Payer: Self-pay

## 2020-07-07 ENCOUNTER — Ambulatory Visit: Payer: BC Managed Care – PPO

## 2020-07-07 NOTE — Telephone Encounter (Signed)
Due to staffing shortage-need to reschedule today's appt-no answer, left voicemail message Sharlette Dense, RN

## 2020-07-09 ENCOUNTER — Other Ambulatory Visit: Payer: Self-pay

## 2020-07-09 ENCOUNTER — Ambulatory Visit (LOCAL_COMMUNITY_HEALTH_CENTER): Payer: BC Managed Care – PPO | Admitting: Family Medicine

## 2020-07-09 ENCOUNTER — Encounter: Payer: Self-pay | Admitting: Family Medicine

## 2020-07-09 VITALS — BP 120/70 | Ht 63.0 in | Wt 149.0 lb

## 2020-07-09 DIAGNOSIS — Z3009 Encounter for other general counseling and advice on contraception: Secondary | ICD-10-CM | POA: Diagnosis not present

## 2020-07-09 DIAGNOSIS — Z3042 Encounter for surveillance of injectable contraceptive: Secondary | ICD-10-CM

## 2020-07-09 DIAGNOSIS — Z30013 Encounter for initial prescription of injectable contraceptive: Secondary | ICD-10-CM

## 2020-07-09 DIAGNOSIS — Z113 Encounter for screening for infections with a predominantly sexual mode of transmission: Secondary | ICD-10-CM

## 2020-07-09 LAB — WET PREP FOR TRICH, YEAST, CLUE
Trichomonas Exam: NEGATIVE
Yeast Exam: NEGATIVE

## 2020-07-09 NOTE — Progress Notes (Signed)
Reference 1 year depo order by Arnetha Courser, CNM dated 03/19/20 (PAP also performed 03/19/20); pt is 16.0 weeks post depo today. Pt received call stating she needed to reschedule appt for PAP.  Also wants STD screening.

## 2020-07-09 NOTE — Progress Notes (Signed)
   [facility]  Subjective:  Michelle Meadows is a 29 y.o. being seen today for   Chief Complaint  Patient presents with  . Contraception    depo (in hip)  . Gynecologic Exam    STD screening    HPI client is here for an STD screen and her Depo.  She had her annual exam in 02/2020   Does the patient have a current or past history of drug use? No   No components found for: HCV]   Health Maintenance Due  Topic Date Due  . Hepatitis C Screening  Never done  . COVID-19 Vaccine (1) Never done  . TETANUS/TDAP  Never done  . INFLUENZA VACCINE  06/22/2020  . PAP-Cervical Cytology Screening  07/06/2020    ROS  The following portions of the patient's history were reviewed and updated as appropriate: allergies, current medications, past family history, past medical history, past social history, past surgical history and problem list. Problem list updated.   See flowsheet for other program required questions.  Objective:   Vitals:   07/09/20 1341  BP: 120/70  Weight: 149 lb (67.6 kg)  Height: 5\' 3"  (1.6 m)    Physical Exam Constitutional:      Appearance: Normal appearance.  HENT:     Mouth/Throat:     Pharynx: Oropharynx is clear. No oropharyngeal exudate.  Abdominal:     General: There is no distension.     Palpations: Abdomen is soft.     Tenderness: There is no abdominal tenderness.  Genitourinary:    Comments: Client declined pelvic exam Self collect wet prep and gc/chlamydia Musculoskeletal:     Cervical back: Neck supple.  Lymphadenopathy:     Cervical: No cervical adenopathy.  Skin:    General: Skin is warm and dry.  Neurological:     Mental Status: She is alert and oriented to person, place, and time.     Assessment and Plan:  Michelle Meadows is a 29 y.o. female presenting to the [facility] Department for a Women's Health problem visit  1. Screening examination for  venereal disease  - HIV [place] [facility] - Syphilis Serology, [place] [facility] - Chlamydia/Gonorrhea [place] [facility] - WET PREP FOR TRICH, YEAST, CLUE  No follow-ups on file.  No future appointments.  [facility], FNP

## 2020-07-10 MED ORDER — MEDROXYPROGESTERONE ACETATE 150 MG/ML IM SUSP
150.0000 mg | Freq: Once | INTRAMUSCULAR | Status: AC
Start: 2020-07-10 — End: 2020-07-09
  Administered 2020-07-09: 150 mg via INTRAMUSCULAR

## 2020-07-10 NOTE — Progress Notes (Addendum)
On 07/09/20: Wet mount reviewed, no tx per standing order. DMPA 150 mg IM administered per Arnetha Courser, CNM order dated 03/19/20 (and confirmation to give by 07/09/20 provider Larene Pickett, FNP). Provider orders completed. Condoms declined.

## 2020-07-10 NOTE — Addendum Note (Signed)
Addended by: Tracey Harries on: 07/10/2020 03:33 PM   Modules accepted: Orders

## 2020-07-17 ENCOUNTER — Ambulatory Visit: Payer: Self-pay | Admitting: Family Medicine

## 2020-07-17 ENCOUNTER — Other Ambulatory Visit: Payer: Self-pay

## 2020-07-17 ENCOUNTER — Encounter: Payer: Self-pay | Admitting: Family Medicine

## 2020-07-17 DIAGNOSIS — A530 Latent syphilis, unspecified as early or late: Secondary | ICD-10-CM

## 2020-07-17 NOTE — Progress Notes (Signed)
Need for SYPHILIS treatment    Patient in clinic for treatment of syphilis.  Patient testing on 8/18/ with a result of 1:8. Patient unsure of when previously tested for syphilis.    Discussed with patient s/sx and need for treatment. Patient questions answered.   Patient treated for Syphilis Per standing order with Bicillin  2.4 million units IM.  Patient informed of the need for 2 additional weeks of treatment.  Patient verbalizes understanding.  Patient advised to stay 15 mins to be monitored, patient left AMA.     Wendi Snipes, RN

## 2020-07-23 DIAGNOSIS — A530 Latent syphilis, unspecified as early or late: Secondary | ICD-10-CM

## 2020-07-23 MED ORDER — PENICILLIN G BENZATHINE 2400000 UNIT/4ML IM SUSP
2.4000 10*6.[IU] | Freq: Once | INTRAMUSCULAR | Status: AC
  Administered 2020-07-23: 2400000 [IU] via INTRAMUSCULAR

## 2020-07-25 ENCOUNTER — Ambulatory Visit: Payer: Self-pay | Admitting: Family Medicine

## 2020-07-25 ENCOUNTER — Other Ambulatory Visit: Payer: Self-pay

## 2020-07-25 DIAGNOSIS — A530 Latent syphilis, unspecified as early or late: Secondary | ICD-10-CM

## 2020-07-25 MED ORDER — PENICILLIN G BENZATHINE 2400000 UNIT/4ML IM SUSP
2.4000 10*6.[IU] | Freq: Once | INTRAMUSCULAR | Status: AC
  Administered 2020-07-25: 2400000 [IU] via INTRAMUSCULAR

## 2020-07-25 NOTE — Progress Notes (Signed)
Attestation of Attending Supervision of Enhanced Role Nurse:  At public health departments, Enhanced Role Nurses work under standing orders and procedures to provide STI related screening services.. I agree with the care provided to this patient and was available for any consultation as needed.  I have reviewed the RN's note and chart.  I was not consulted by the RN for the patient. If consulted documentation of consult is reflected in the RN's documentation.  ° °Tationa Stech Niles Maire Govan, MD, MPH, ABFM °Medical Director  °[facility].  ° °

## 2020-07-25 NOTE — Progress Notes (Signed)
Patient in clinic for treatment #2  of syphilis.  Patient testing on 8/18/ with a result of 1:8.  Patient questions answered.   Patient treated for Syphilis Per standing order with Bicillin  2.4 million units IM.  Patient informed of the need for 1 additional weeks of treatment.  Patient verbalizes understanding.  Patient advised to stay 15 mins to be monitored, patient again left AMA.    Wendi Snipes, RN

## 2020-08-01 ENCOUNTER — Ambulatory Visit: Payer: Self-pay

## 2020-08-01 ENCOUNTER — Other Ambulatory Visit: Payer: Self-pay

## 2020-08-01 DIAGNOSIS — A530 Latent syphilis, unspecified as early or late: Secondary | ICD-10-CM

## 2020-08-01 MED ORDER — PENICILLIN G BENZATHINE 2400000 UNIT/4ML IM SUSP
2.4000 10*6.[IU] | Freq: Once | INTRAMUSCULAR | Status: AC
  Administered 2020-08-01: 2400000 [IU] via INTRAMUSCULAR

## 2020-08-01 NOTE — Progress Notes (Signed)
Here today for Bicillin #3 injections. Reports no problems from previous bicillin. Verbal order by C. Culber[place], [place] today to administer Bicillin 2.4 mu IM deep in UOQ of buttocks. Bicillin 1.2 mu/58ml given IM in LUOQ and Bicillin  1.2 mu/17ml given IM RUOQ without problem. Pt refused to stay for 15 min observation. Pt to call STD Coord. Elveria Rising, RN with questions/concerns. Jerel Shepherd, RN

## 2020-08-02 NOTE — Progress Notes (Addendum)
Consulted by RN re:  Patient here for #3 of 3 sets of Bicillin for latent Syphilis but RN unable to find order for Bicillin in chart.  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  Per review of chart, order not put in for the Bicillin 2.4 mu IM x 3 at initiation of treatment, though note by [facility] states to treat per standing order.  ERRN can operate per standing order.  I gave verbal order for RN to proceed with treatment # 3 today.

## 2020-09-26 ENCOUNTER — Ambulatory Visit: Payer: BC Managed Care – PPO

## 2020-09-26 ENCOUNTER — Other Ambulatory Visit: Payer: Self-pay

## 2020-09-26 ENCOUNTER — Ambulatory Visit (LOCAL_COMMUNITY_HEALTH_CENTER): Payer: BC Managed Care – PPO | Admitting: Physician Assistant

## 2020-09-26 VITALS — BP 110/73 | Ht 62.0 in | Wt 153.0 lb

## 2020-09-26 DIAGNOSIS — Z3042 Encounter for surveillance of injectable contraceptive: Secondary | ICD-10-CM | POA: Diagnosis not present

## 2020-09-26 DIAGNOSIS — Z3009 Encounter for other general counseling and advice on contraception: Secondary | ICD-10-CM | POA: Diagnosis not present

## 2020-09-26 DIAGNOSIS — Z113 Encounter for screening for infections with a predominantly sexual mode of transmission: Secondary | ICD-10-CM

## 2020-09-26 LAB — WET PREP FOR TRICH, YEAST, CLUE
Trichomonas Exam: NEGATIVE
Yeast Exam: NEGATIVE

## 2020-09-26 MED ORDER — MEDROXYPROGESTERONE ACETATE 150 MG/ML IM SUSP
150.0000 mg | INTRAMUSCULAR | Status: AC
  Administered 2020-09-26: 150 mg via INTRAMUSCULAR

## 2020-09-26 NOTE — Progress Notes (Signed)
   [facility] problem [facility]  Subjective:  Michelle Meadows is a 29 y.o. being seen today for Depo and STD screening.  Chief Complaint  Patient presents with  . Contraception    HPI Patient states that she is not having any vaginal symptoms but would like a screening today.  States that it is also time for her Depo and would like to get it in her hip.  Denies problems with the Depo and desires to continue with this as her BCM.   Does the patient have a current or past history of drug use? No   No components found for: HCV]   Health Maintenance Due  Topic Date Due  . Hepatitis C Screening  Never done  . COVID-19 Vaccine (1) Never done  . TETANUS/TDAP  Never done  . INFLUENZA VACCINE  Never done  . PAP-Cervical Cytology Screening  07/06/2020    Review of Systems  All other systems reviewed and are negative.   The following portions of the patient's history were reviewed and updated as appropriate: allergies, current medications, past family history, past medical history, past social history, past surgical history and problem list. Problem list updated.   See flowsheet for other program required questions.  Objective:   Vitals:   09/26/20 1645  BP: 110/73  Weight: 153 lb (69.4 kg)  Height: 5\' 2"  (1.575 m)    Physical Exam Vitals reviewed.  Constitutional:      General: She is not in acute distress.    Appearance: Normal appearance.  HENT:     Head: Normocephalic and atraumatic.  Eyes:     Conjunctiva/sclera: Conjunctivae normal.  Pulmonary:     Effort: Pulmonary effort is normal.  Musculoskeletal:     Cervical back: Neck supple. No tenderness.  Lymphadenopathy:     Cervical: No cervical adenopathy.  Skin:    General: Skin is warm and dry.     Findings: No bruising, erythema, lesion or rash.  Neurological:     Mental Status: She is alert and oriented to person, place, and time.  Psychiatric:         Mood and Affect: Mood normal.        Behavior: Behavior normal.        Thought Content: Thought content normal.        Judgment: Judgment normal.       Assessment and Plan:  Michelle Meadows is a 29 y.o. female presenting to the [facility] Department for a Women's Health problem visit  1. Encounter for counseling regarding contraception Reviewed with patient when to call clinic with irregular bleeding. Enc to use condoms with all sex for STD protection.   2. Screening for STD (sexually transmitted disease) Patient opts to self collect vaginal samples for testing today.  Counseled/reviewed with patient how to collect for accurate results. Await test results.  Counseled that RN will call if needs to RTC for treatment once results are back.  - WET PREP FOR TRICH, YEAST, CLUE - Chlamydia/Gonorrhea [place] [facility] - HIV [place] [facility] - Syphilis Serology, [place] [facility]  3. Surveillance for Depo-Provera contraception Depo 150 mg IM given in R GM today and patient tolerated well. - medroxyPROGESTERone (DEPO-PROVERA) injection 150 mg     No follow-ups on file.  No future appointments.  [facility], PA

## 2021-01-16 ENCOUNTER — Ambulatory Visit: Payer: BC Managed Care – PPO

## 2021-01-27 ENCOUNTER — Ambulatory Visit (LOCAL_COMMUNITY_HEALTH_CENTER): Payer: BC Managed Care – PPO | Admitting: Advanced Practice Midwife

## 2021-01-27 ENCOUNTER — Encounter: Payer: Self-pay | Admitting: Advanced Practice Midwife

## 2021-01-27 ENCOUNTER — Other Ambulatory Visit: Payer: Self-pay

## 2021-01-27 VITALS — BP 109/70 | HR 83 | Ht 62.0 in | Wt 154.0 lb

## 2021-01-27 DIAGNOSIS — Z113 Encounter for screening for infections with a predominantly sexual mode of transmission: Secondary | ICD-10-CM

## 2021-01-27 DIAGNOSIS — Z3009 Encounter for other general counseling and advice on contraception: Secondary | ICD-10-CM | POA: Diagnosis not present

## 2021-01-27 DIAGNOSIS — Z30013 Encounter for initial prescription of injectable contraceptive: Secondary | ICD-10-CM

## 2021-01-27 DIAGNOSIS — R87610 Atypical squamous cells of undetermined significance on cytologic smear of cervix (ASC-US): Secondary | ICD-10-CM

## 2021-01-27 DIAGNOSIS — Z3042 Encounter for surveillance of injectable contraceptive: Secondary | ICD-10-CM

## 2021-01-27 DIAGNOSIS — F17200 Nicotine dependence, unspecified, uncomplicated: Secondary | ICD-10-CM | POA: Diagnosis not present

## 2021-01-27 DIAGNOSIS — A599 Trichomoniasis, unspecified: Secondary | ICD-10-CM | POA: Insufficient documentation

## 2021-01-27 LAB — WET PREP FOR TRICH, YEAST, CLUE
Trichomonas Exam: POSITIVE — AB
Yeast Exam: NEGATIVE

## 2021-01-27 LAB — PREGNANCY, URINE: Preg Test, Ur: NEGATIVE

## 2021-01-27 MED ORDER — MEDROXYPROGESTERONE ACETATE 150 MG/ML IM SUSP
150.0000 mg | Freq: Once | INTRAMUSCULAR | Status: AC
Start: 2021-01-27 — End: 2021-01-27
  Administered 2021-01-27: 150 mg via INTRAMUSCULAR

## 2021-01-27 MED ORDER — METRONIDAZOLE 500 MG PO TABS
500.0000 mg | ORAL_TABLET | Freq: Two times a day (BID) | ORAL | 0 refills | Status: DC
Start: 2021-01-27 — End: 2022-01-14

## 2021-01-27 NOTE — Progress Notes (Signed)
Presents for Depo, STD screen. Treated for trich per standing order. Depo given, tolerated well. [name] S Michelle Meadows

## 2021-01-27 NOTE — Progress Notes (Signed)
Contraception/Family Planning VISIT ENCOUNTER NOTE  Subjective:   Michelle Meadows is a 30 y.o.SBF G2P1001(6 yo son) smoker female here for reproductive life counseling.  Desires DMPA for BCM.  Reports she does not want a pregnancy in the next year. Denies abnormal vaginal bleeding, discharge, pelvic pain, problems with intercourse or other gynecologic concerns. Pt here for DMPA (last DMPA 09/26/20: 17.4 wks) and STD screen and insists on self collection.  Last sex 01/16/21 without condom with new partner first time; 2 partners in last 3 mo.  LMP ?Marland Kitchen Last pap 03/19/20 ASCUS -HPV and needs repeat 02/2023.  Smoking 8-10 cpd, last MJ age 30, last ETOH 2021.  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   Gynecologic History No LMP recorded. Patient has had an injection. Contraception: Depo-Provera injections  Health Maintenance Due  Topic Date Due  . Hepatitis C Screening  Never done  . COVID-19 Vaccine (1) Never done  . TETANUS/TDAP  Never done  . INFLUENZA VACCINE  Never done  . PAP-Cervical Cytology Screening  07/06/2020     The following portions of the patient's history were reviewed and updated as appropriate: allergies, current medications, past family history, past medical history, past social history, past surgical history and problem list.  Review of Systems Pertinent items are noted in HPI.   Objective:  BP 109/70   Pulse 83   Ht 5\' 2"  (1.575 m)   Wt 154 lb (69.9 kg)   BMI 28.17 kg/m  Gen: well appearing, NAD HEENT: no scleral icterus CV: RR Lung: Normal WOB Ext: warm well perfused  PELVIC: Normal appearing external genitalia; normal appearing vaginal mucosa and cervix.  No abnormal discharge noted.  Pap smear obtained on 03/19/20 ASCUS HPV neg.  Pt wants to self collect   Assessment and Plan:   Contraception counseling: Reviewed all forms of birth control options in the tiered based approach. available including abstinence; over the counter/barrier methods;  hormonal contraceptive medication including pill, patch, ring, injection,contraceptive implant, ECP; hormonal and nonhormonal IUDs; permanent sterilization options including vasectomy and the various tubal sterilization modalities. Risks, benefits, and typical effectiveness rates were reviewed.  Questions were answered.  Written information was also given to the patient to review.  Patient desires DMPA, this was prescribed for patient. She will follow up in  11-13 wks for surveillance.  She was told to call with any further questions, or with any concerns about this method of contraception.  Emphasized use of condoms 100% of the time for STI prevention.  Patient was offered ECP. ECP was not accepted by the patient. ECP counseling was not given - see RN documentation  1. Surveillance for Depo-Provera contraception If PT neg today may have DMPA 150 mg IM q 11-13 wks x1 Please counsel pt to do home PT on 01/30/21 and call if + Please counsel pt on abstinance/back up condoms next 7 days  2. Smoker 3/4 ppd Counseled via 5 A's to stop smokiing  3. Atypical squamous cells of undetermined significance on cytologic smear of cervix (ASC-US) Will need pap 02/2023  4. Screening examination for venereal disease Pt insists on self collecting GC/Chlamydia, wet mount and has no sxs Treat for trich on wet mount and give 2 contact cards Treat wet mount per standing orders Immunization nurse consult - Syphilis Serology, [place] [facility] - HIV [place] [facility] - Chlamydia/Gonorrhea [place] [facility]  5. Family planning Needs physical 02/2021  6. Encounter for surveillance of injectable contraceptive  - Pregnancy, urine  Please refer to After Visit Summary for other counseling recommendations.   No follow-ups on file.  Herbie Saxon, [place]

## 2021-01-28 ENCOUNTER — Telehealth: Payer: Self-pay | Admitting: Family Medicine

## 2021-01-28 NOTE — Telephone Encounter (Signed)
Pt states she was given medication yesterday for STD treatment and she can not take it because of the taste. It makes her vomit. She wants to know if there is something else she can take. Best time to call is at 8am, 10:30am, 12:00pm or after 2:00 pm. If she is doesn't answer she has asked that a voicemail be left on her phone with the information she needs.Marland Kitchen

## 2021-01-29 NOTE — Telephone Encounter (Signed)
Call to patient. LMOM for patient to schedule appt to get different medication at [phone] as requested by patient. Harvie Heck, RN

## 2021-01-29 NOTE — Telephone Encounter (Signed)
Phone call to pt. Discussed tx for trich and gave suggestions on ways that might help her take the medicine without tasting it as much and in turn help her keep it down. Pt states she will try suggestions.

## 2021-02-04 ENCOUNTER — Telehealth: Payer: Self-pay | Admitting: Student

## 2021-02-05 NOTE — Telephone Encounter (Signed)
Text message from pt stating that she cannot tolerate Metronidazole, treatment for trich. She vomits within 2 hours. Elveria Rising consulted. Ulyess Blossom, RN called pt pharmacy on file to inquire about liquid tinidazole option. This option is not available. This RN re counseled pt to take medication with food & drink. This RN instructed pt that she would have to restart medication for appropriate dosage and treatment. No further response from pt. Sharlyne Pacas, RN

## 2021-05-07 IMAGING — CR DG SHOULDER 2+V*L*
1 series · 3 of 3 positions shown · non-contrast
Comparison: None.

CLINICAL DATA: Shoulder pain

EXAM:
LEFT SHOULDER - 2+ VIEW

[Series 1: dg shoulder left · 0.14mm/px · 3 of 3 slices shown]
[im 1/3]
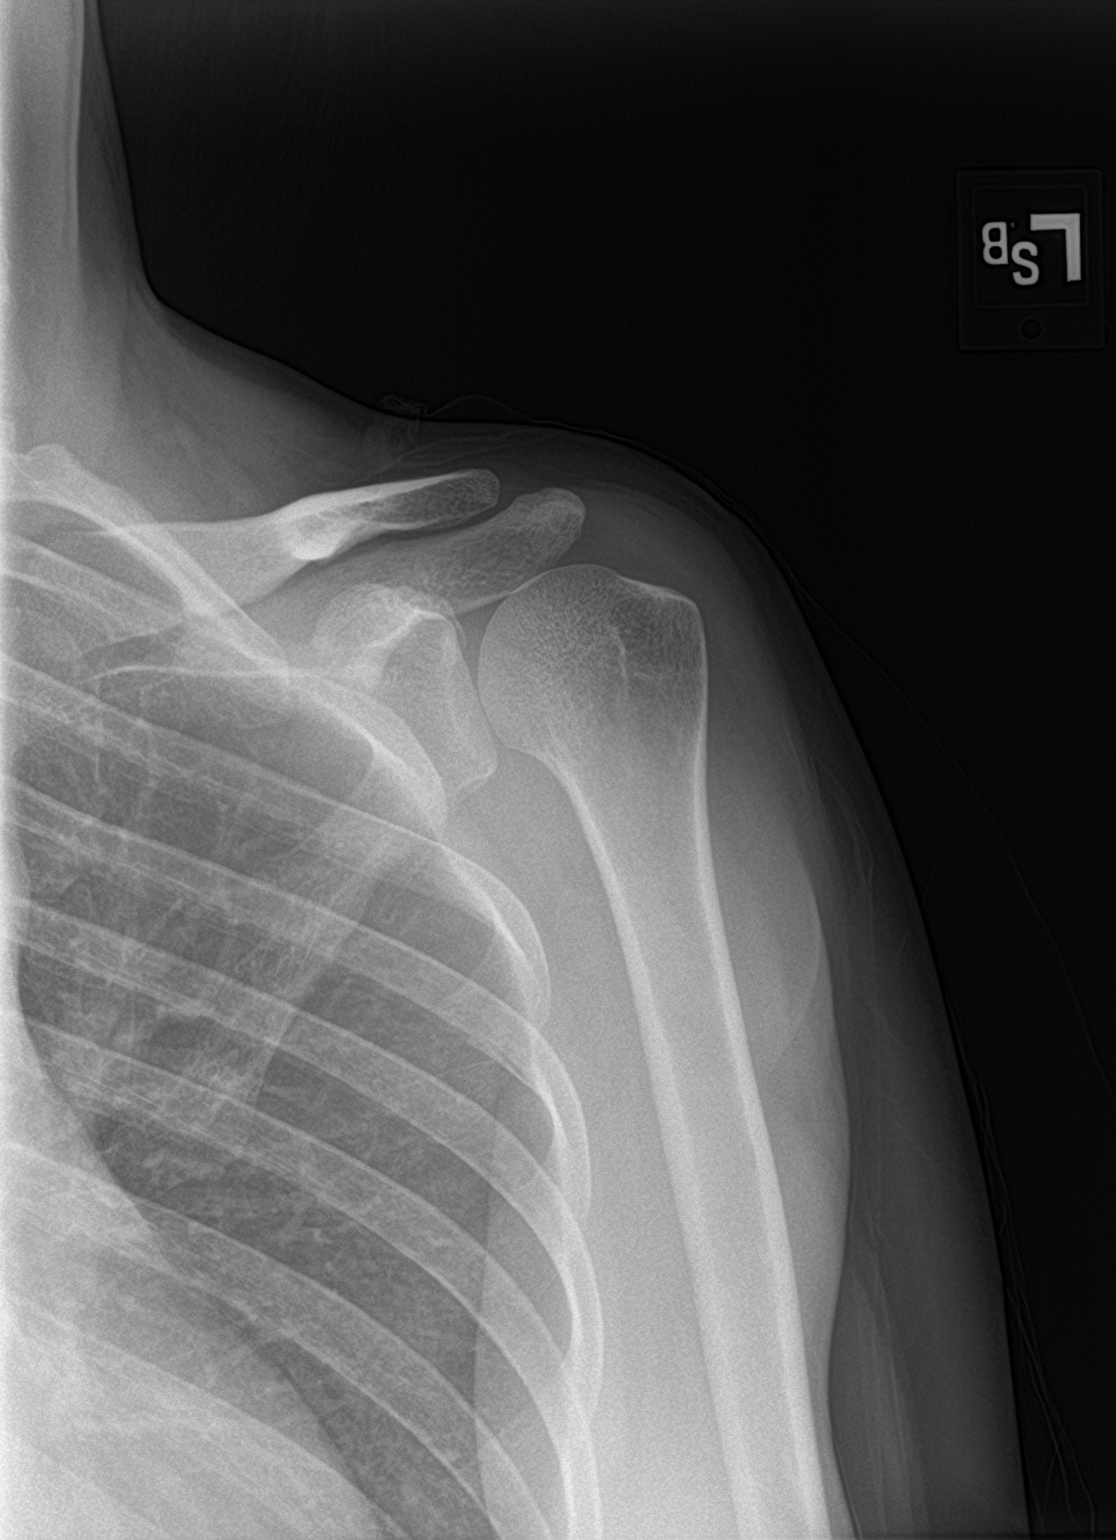
[im 2/3]
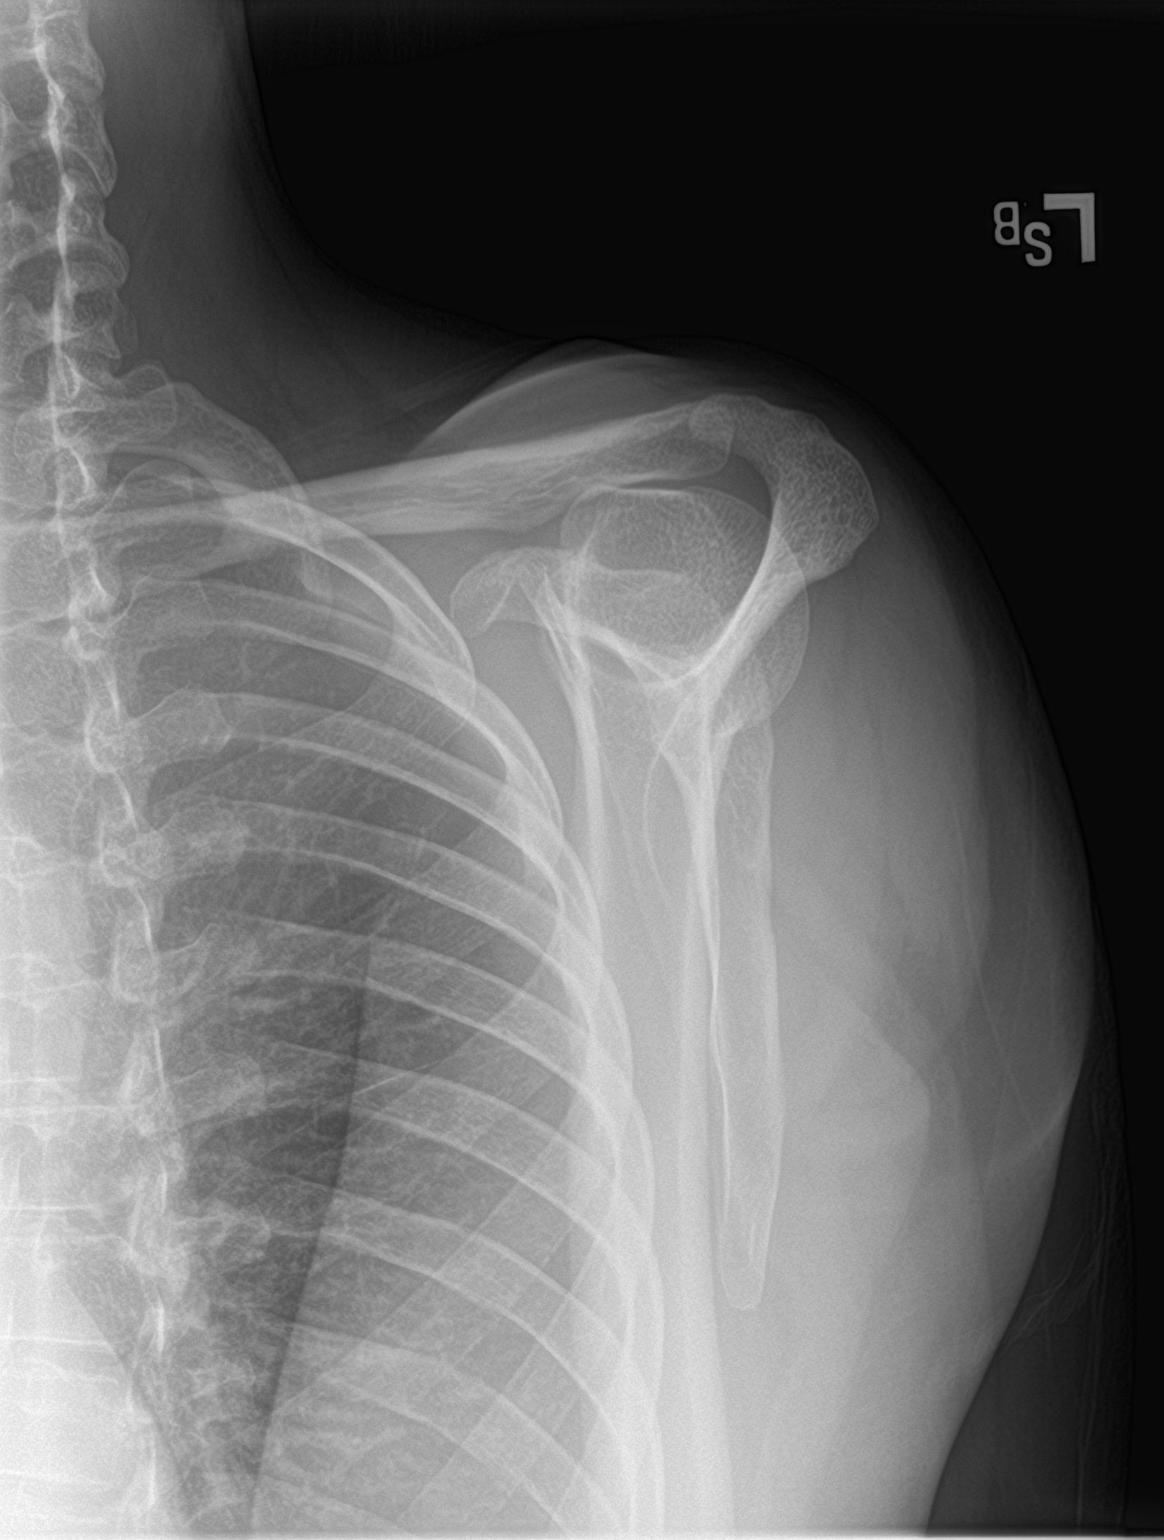
[im 3/3]
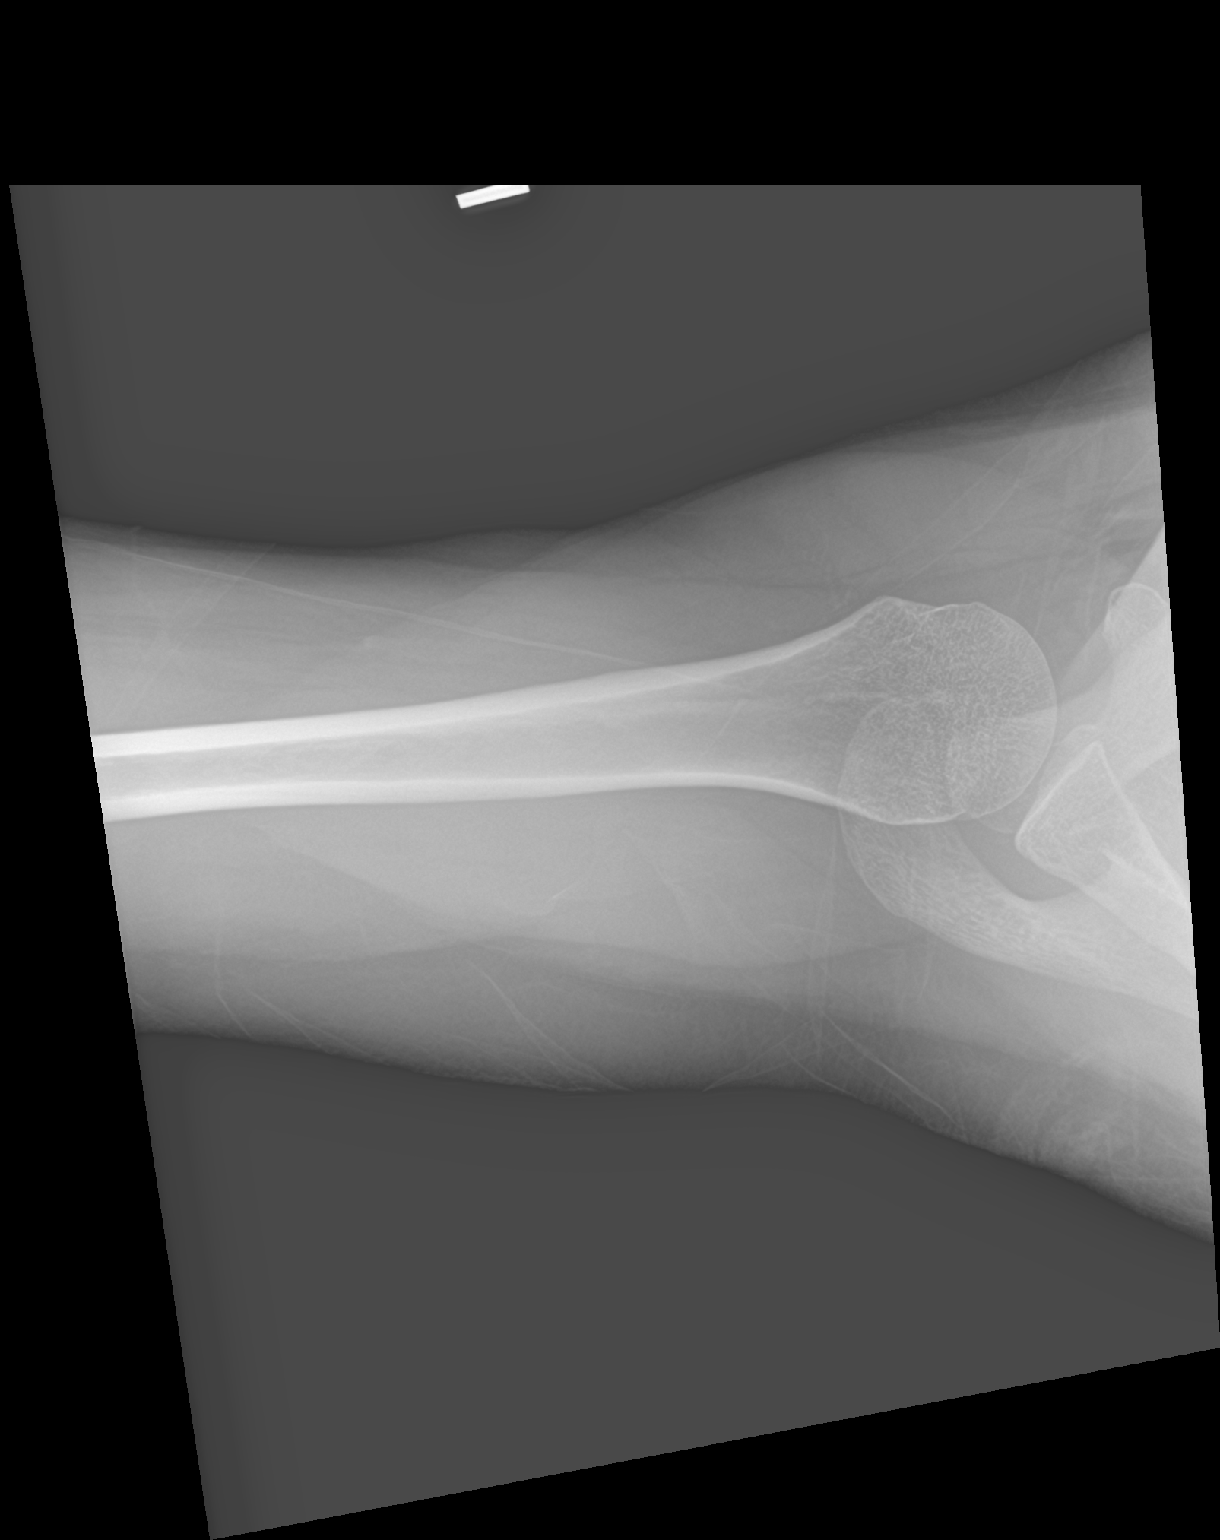

[3 of 3 positions shown; findings below may reference images not displayed]

FINDINGS: There is no evidence of fracture or dislocation. There is no
evidence of arthropathy or other focal bone abnormality. Soft
tissues are unremarkable.
IMPRESSION: Negative.

## 2021-05-14 ENCOUNTER — Encounter: Payer: Self-pay | Admitting: Advanced Practice Midwife

## 2021-05-14 ENCOUNTER — Other Ambulatory Visit: Payer: Self-pay

## 2021-05-14 ENCOUNTER — Ambulatory Visit (LOCAL_COMMUNITY_HEALTH_CENTER): Payer: BC Managed Care – PPO | Admitting: Advanced Practice Midwife

## 2021-05-14 VITALS — BP 109/68 | Ht 62.0 in | Wt 140.8 lb

## 2021-05-14 DIAGNOSIS — Z3042 Encounter for surveillance of injectable contraceptive: Secondary | ICD-10-CM | POA: Diagnosis not present

## 2021-05-14 DIAGNOSIS — Z3009 Encounter for other general counseling and advice on contraception: Secondary | ICD-10-CM | POA: Diagnosis not present

## 2021-05-14 LAB — PREGNANCY, URINE: Preg Test, Ur: NEGATIVE

## 2021-05-14 MED ORDER — MEDROXYPROGESTERONE ACETATE 150 MG/ML IM SUSP
150.0000 mg | Freq: Once | INTRAMUSCULAR | Status: AC
  Administered 2021-05-14: 150 mg via INTRAMUSCULAR

## 2021-05-14 NOTE — Addendum Note (Signed)
Addended by: Berdie Ogren on: 05/14/2021 05:29 PM   Modules accepted: Orders

## 2021-05-14 NOTE — Progress Notes (Signed)
Pt here for Depo injection.  Pt signed consent form and Depo 150 mg given IM without any complications. Pt informed that she would need a PE before next Depo could be given. Berdie Ogren, RN

## 2021-05-14 NOTE — Progress Notes (Signed)
Contraception/Family Planning VISIT ENCOUNTER NOTE  Subjective:   Michelle Meadows is a 30 y.o. SBF G2P1001 (44 yo son) smoker female here for reproductive life counseling.  Desires DMPA for BCM.  Reports she does not want a pregnancy in the next year. Denies abnormal vaginal bleeding, discharge, pelvic pain, problems with intercourse or other gynecologic concerns. Last physical 2019. LMP none. Last sex end of April without condom; not with that partner anymore. Pt has consistently been late for all her DMPA's and not following up with PT's. Wants more DMPA today. Last DMPA 01/27/21. Smoker 3/4 ppd. Employed 40 hrs/wk and at [employer] (G[employer]).   Gynecologic History No LMP recorded. Patient has had an injection. Contraception: none  Health Maintenance Due  Topic Date Due   COVID-19 Vaccine (1) Never done   Pneumococcal Vaccine 5-70 Years old (1 - PCV) Never done   Hepatitis C Screening  Never done   TETANUS/TDAP  Never done   PAP-Cervical Cytology Screening  07/06/2020     The following portions of the patient's history were reviewed and updated as appropriate: allergies, current medications, past family history, past medical history, past social history, past surgical history and problem list.  Review of Systems Pertinent items are noted in HPI.   Objective:  Ht 5\' 2"  (1.575 m)   BMI 28.17 kg/m  Gen: well appearing, NAD HEENT: no scleral icterus CV: RR Lung: Normal WOB Ext: warm well perfused   Assessment and Plan:   Contraception counseling: Reviewed all forms of birth control options in the tiered based approach. available including abstinence; over the counter/barrier methods; hormonal contraceptive medication including pill, patch, ring, injection,contraceptive implant, ECP; hormonal and nonhormonal IUDs; permanent sterilization options including vasectomy and the various tubal sterilization modalities. Risks, benefits, and typical effectiveness rates were reviewed.  Questions were  answered.  Written information was also given to the patient to review.  Patient desires DMPA, this was prescribed for patient. She will follow up in  ASAP for physical.  She was told to call with any further questions, or with any concerns about this method of contraception.  Emphasized use of condoms 100% of the time for STI prevention.  Patient was offered ECP. ECP was not accepted by the patient. ECP counseling was not given - see RN documentation  1. Family planning Has not had physical since 2019  2. Encounter for surveillance of injectable contraceptive May have DMPA  today if PT neg today Needs physical ASAP or cannot get any more DMPA. Last PE 2019. Last pap 03/19/20 ASCUS HPV neg - Pregnancy, urine    Please refer to After Visit Summary for other counseling recommendations.   Return for yearly physical exam ASAP.  03/21/20, CNM [facility] DEPARTMENT

## 2021-09-27 ENCOUNTER — Other Ambulatory Visit: Payer: Self-pay

## 2021-09-27 ENCOUNTER — Emergency Department
Admission: EM | Admit: 2021-09-27 | Discharge: 2021-09-28 | Disposition: A | Discharge status: Home / Self Care | Payer: BC Managed Care – PPO | Attending: Student in an Organized Health Care Education/Training Program | Admitting: Student in an Organized Health Care Education/Training Program

## 2021-09-27 ENCOUNTER — Encounter: Payer: Self-pay | Admitting: Emergency Medicine

## 2021-09-27 DIAGNOSIS — A599 Trichomoniasis, unspecified: Secondary | ICD-10-CM | POA: Insufficient documentation

## 2021-09-27 DIAGNOSIS — Z5321 Procedure and treatment not carried out due to patient leaving prior to being seen by health care provider: Secondary | ICD-10-CM | POA: Diagnosis not present

## 2021-09-27 DIAGNOSIS — A64 Unspecified sexually transmitted disease: Secondary | ICD-10-CM | POA: Insufficient documentation

## 2021-09-27 LAB — URINALYSIS, ROUTINE W REFLEX MICROSCOPIC
Bacteria, UA: NONE SEEN
Bilirubin Urine: NEGATIVE
Glucose, UA: NEGATIVE mg/dL
Ketones, ur: NEGATIVE mg/dL
Nitrite: NEGATIVE
Protein, ur: NEGATIVE mg/dL
Specific Gravity, Urine: 1.018 (ref 1.005–1.030)
pH: 6 (ref 5.0–8.0)

## 2021-09-27 LAB — POC URINE PREG, ED: Preg Test, Ur: NEGATIVE

## 2021-09-27 LAB — PREGNANCY, URINE: Preg Test, Ur: NEGATIVE

## 2021-09-27 NOTE — ED Triage Notes (Signed)
Pt via POV from home. Pt had a new sexual partner that has been exposed to trichomonas. Pt denies any symptoms just wants to be tested. Pt is A&OX4 and NAD.

## 2021-09-27 NOTE — ED Provider Notes (Signed)
Emergency Medicine Provider Triage Evaluation Note  Michelle Meadows , a 30 y.o. female  was evaluated in triage.  Pt comes in with partner who has been exposed to trichomonas. She denies symptoms.  Review of Systems  Positive: None Negative: Fever, abdominal pain, vaginal discharge  Physical Exam  There were no vitals taken for this visit. Gen:   Awake, no distress   Resp:  Normal effort  MSK:   Moves extremities without difficulty  Other:    Medical Decision Making  Medically screening exam initiated at 5:22 PM.  Appropriate orders placed.  Michelle Meadows was informed that the remainder of the evaluation will be completed by another provider, this initial triage assessment does not replace that evaluation, and the importance of remaining in the ED until their evaluation is complete.    Chinita Pester, FNP 09/27/21 1725    Willy Eddy, MD 09/27/21 1907

## 2021-09-28 NOTE — ED Notes (Addendum)
Pt to front stops Clinical research associate in lobby to inform she is leaving and is following up at health department in AM. Pt ambulatory out of WR with steady gait and no acute distress noted.

## 2021-10-17 ENCOUNTER — Emergency Department
Admission: EM | Admit: 2021-10-17 | Discharge: 2021-10-17 | Discharge status: Against Medical Advice | Payer: BC Managed Care – PPO

## 2021-10-17 NOTE — ED Triage Notes (Signed)
Pt called for triage, no response. 

## 2021-10-21 ENCOUNTER — Ambulatory Visit (LOCAL_COMMUNITY_HEALTH_CENTER): Payer: BC Managed Care – PPO | Admitting: Family Medicine

## 2021-10-21 ENCOUNTER — Other Ambulatory Visit: Payer: Self-pay

## 2021-10-21 ENCOUNTER — Encounter: Payer: Self-pay | Admitting: Family Medicine

## 2021-10-21 VITALS — BP 118/68 | Ht 62.0 in | Wt 129.8 lb

## 2021-10-21 DIAGNOSIS — A599 Trichomoniasis, unspecified: Secondary | ICD-10-CM

## 2021-10-21 DIAGNOSIS — Z3042 Encounter for surveillance of injectable contraceptive: Secondary | ICD-10-CM | POA: Diagnosis not present

## 2021-10-21 DIAGNOSIS — Z3009 Encounter for other general counseling and advice on contraception: Secondary | ICD-10-CM | POA: Diagnosis not present

## 2021-10-21 DIAGNOSIS — Z0000 Encounter for general adult medical examination without abnormal findings: Secondary | ICD-10-CM | POA: Diagnosis not present

## 2021-10-21 DIAGNOSIS — Z113 Encounter for screening for infections with a predominantly sexual mode of transmission: Secondary | ICD-10-CM

## 2021-10-21 LAB — WET PREP FOR TRICH, YEAST, CLUE
Trichomonas Exam: POSITIVE — AB
Yeast Exam: NEGATIVE

## 2021-10-21 MED ORDER — METRONIDAZOLE 0.75 % VA GEL: 1.0000 | Freq: Every day | VAGINAL | 0 refills | Status: DC

## 2021-10-21 MED ORDER — MEDROXYPROGESTERONE ACETATE 150 MG/ML IM SUSP
150.0000 mg | INTRAMUSCULAR | Status: AC
  Administered 2021-10-21 – 2022-01-11 (×2): 150 mg via INTRAMUSCULAR

## 2021-10-21 MED ORDER — MEDROXYPROGESTERONE ACETATE 150 MG/ML IM SUSP: 150.0000 mg | INTRAMUSCULAR | 0 refills | Status: DC

## 2021-10-25 NOTE — Progress Notes (Signed)
[facility] DEPARTMENT [facility] [address] Main Number: [phone]  Family Planning Visit- Repeat Yearly Visit  Subjective:  Michelle Meadows is a 30 y.o. G1P0001  being seen today for an annual wellness visit and to discuss contraception options.   The patient is currently using Hormonal Injection for pregnancy prevention. Patient does not want a pregnancy in the next year. 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  Chief Complaint  Patient presents with   Contraception    PE and Depo    Patient reports here for physical and depo. Also reports vaginal d/c.   Patient denies any concerns for STI's   See flowsheet for other program required questions.   Body mass index is 23.74 kg/m. - Patient is eligible for diabetes screening based on BMI and age >70?  no HA1C ordered? no  Patient reports 2 of partners in last year. Desires STI screening?  Yes   Has patient been screened once for HCV in the past?  No  No results found for: HCVAB  Does the patient have current of drug use, have a partner with drug use, and/or has been incarcerated since last result? No  If yes-- Screen for HCV through [facility]   Does the patient meet criteria for HBV testing? No  Criteria:  -Household, sexual or needle sharing contact with HBV -History of drug use -HIV positive -Those with known Hep C   Health Maintenance Due  Topic Date Due   COVID-19 Vaccine (1) Never done   Pneumococcal Vaccine 51-74 Years old (1 - PCV) Never done   Hepatitis C Screening  Never done   TETANUS/TDAP  Never done   PAP-Cervical Cytology Screening  07/06/2020   INFLUENZA VACCINE  Never done    ROS  The following portions of the patient's history were reviewed and updated as appropriate: allergies,  current medications, past family history, past medical history, past social history, past surgical history and problem list. Problem list updated.  Objective:   Vitals:   10/21/21 1312  BP: 118/68  Weight: 129 lb 12.8 oz (58.9 kg)  Height: 5\' 2"  (1.575 m)    Physical Exam Vitals and nursing note reviewed.  Constitutional:      Appearance: Normal appearance.  HENT:     Head: Normocephalic and atraumatic.     Mouth/Throat:     Mouth: Mucous membranes are moist. No oral lesions.     Pharynx: No oropharyngeal exudate or posterior oropharyngeal erythema.     Comments: Poor dentition   Eyes:     General: No scleral icterus. Neck:     Thyroid: No thyroid mass, thyromegaly or thyroid tenderness.  Cardiovascular:     Rate and Rhythm: Normal rate and regular rhythm.     Pulses: Normal pulses.  Pulmonary:     Effort: Pulmonary effort is normal.     Breath sounds: Normal breath sounds.  Chest:     Comments: Breasts:        Right: Normal. No swelling, mass, nipple discharge, skin change or tenderness.        Left: Normal. No swelling, mass, nipple discharge, skin change or tenderness.   Abdominal:     General: Abdomen is flat. Bowel sounds are normal.     Palpations: Abdomen is soft.  Genitourinary:    General: Normal vulva.  Rectum: Normal.     Comments: External genitalia without, lice, nits, erythema, edema , lesions or inguinal adenopathy. Vagina with normal mucosa and , odorous off white discharge and pH > 4.  Cervix without visual lesions, uterus firm, mobile, non-tender, no masses, CMT adnexal fullness or tenderness.   Musculoskeletal:        General: Normal range of motion.     Cervical back: Normal range of motion and neck supple.  Skin:    General: Skin is warm and dry.  Neurological:     General: No focal deficit present.     Mental Status: She is alert and oriented to person, place, and time.  Psychiatric:        Mood and Affect: Mood normal.        Behavior:  Behavior normal.      Assessment and Plan:  Michelle Meadows is a 30 y.o. female G1P0001 presenting to the [facility] Department for an yearly wellness and contraception visit   1. Routine general medical examination at a health care facility Well woman exam  CBE today  Pap due 02/2023  2. Screening examination for venereal disease Wet prep results today, all other results pending.  - Chlamydia/Gonorrhea [facility] - WET PREP FOR TRICH, YEAST, CLUE - Chlamydia/Gonorrhea [facility]  3. Trichimoniasis Wet prep + for Trich.   Pt is allergic to PO metronidazole- SOB, Tinidazole is also contraindicated.  Patient treated with metro gel.  Patient to RTC doe TOC 14 days after completing medication.  If still positive, may have to consider desensitizing for oral metronidazole.    - metroNIDAZOLE (VANDAZOLE) 0.75 % vaginal gel; Place 1 Applicatorful vaginally at bedtime.  Dispense: 70 g; Refill: 0  4. Encounter for surveillance of injectable contraceptive Pt last depo was 22 weeks ago.  Discussed with patient the importance of keeping depo every 11-13 weeks.   Ok for depo provera 150 mg IM every 11-13 weeks x 1 year.    - medroxyPROGESTERone (DEPO-PROVERA) injection 150 mg  5. Family planning counseling Contraception counseling: Reviewed all forms of birth control options in the tiered based approach. available including abstinence; over the counter/barrier methods; hormonal contraceptive medication including pill, patch, ring, injection,contraceptive implant, ECP; hormonal and nonhormonal IUDs; permanent sterilization options including vasectomy and the various tubal sterilization modalities. Risks, benefits, and typical effectiveness rates were reviewed.  Questions were answered.  Written information was also given to the patient to review.  Patient desires to continue DMPA , this was prescribed for patient. She will follow up in  11-13 weeks  for surveillance.   Pt  was  told to call with any further questions, or with any concerns about this method of contraception.  Emphasized use of condoms 100% of the time for STI prevention.  Patient was offered ECP based on Unprotected sex within past 120 hours. ECP was not accepted by the patient. ECP counseling was not given.     Return 11-13 weeks, for Depo.  No future appointments.  Wendi Snipes, FNP

## 2021-10-26 ENCOUNTER — Telehealth: Payer: Self-pay

## 2021-10-26 ENCOUNTER — Telehealth: Payer: Self-pay | Admitting: Family Medicine

## 2021-10-26 NOTE — Telephone Encounter (Signed)
Pt was returning Michelle Meadows's call from today. Please call her back. Thanks

## 2021-10-26 NOTE — Telephone Encounter (Signed)
Pt notified to make return appointment the week of 12/19 and to avoid sex with untreated partners, due to limited treatment for Trich.  Pt is at work, but she will call back to schedule a f/u appointment. Berdie Ogren, RN

## 2021-10-27 NOTE — Telephone Encounter (Signed)
No additional notes needed  

## 2022-01-11 ENCOUNTER — Other Ambulatory Visit: Payer: Self-pay

## 2022-01-11 ENCOUNTER — Ambulatory Visit (LOCAL_COMMUNITY_HEALTH_CENTER): Payer: BC Managed Care – PPO | Admitting: Family Medicine

## 2022-01-11 DIAGNOSIS — A599 Trichomoniasis, unspecified: Secondary | ICD-10-CM

## 2022-01-11 DIAGNOSIS — Z30013 Encounter for initial prescription of injectable contraceptive: Secondary | ICD-10-CM

## 2022-01-11 DIAGNOSIS — Z113 Encounter for screening for infections with a predominantly sexual mode of transmission: Secondary | ICD-10-CM

## 2022-01-11 DIAGNOSIS — Z3009 Encounter for other general counseling and advice on contraception: Secondary | ICD-10-CM | POA: Diagnosis not present

## 2022-01-11 DIAGNOSIS — Z3042 Encounter for surveillance of injectable contraceptive: Secondary | ICD-10-CM

## 2022-01-11 LAB — HM HIV SCREENING LAB: HM HIV Screening: NEGATIVE

## 2022-01-11 LAB — WET PREP FOR TRICH, YEAST, CLUE
Trichomonas Exam: POSITIVE — AB
Yeast Exam: NEGATIVE

## 2022-01-11 MED ORDER — TINIDAZOLE 500 MG PO TABS: 2.0000 g | ORAL_TABLET | Freq: Once | ORAL | 0 refills | Status: AC

## 2022-01-11 MED ORDER — BORIC ACID VAGINAL 600 MG VA SUPP: 600.0000 mg | Freq: Two times a day (BID) | VAGINAL | 0 refills | Status: AC

## 2022-01-11 NOTE — Progress Notes (Signed)
[facility] problem visit  [facility]  Subjective:  Michelle Meadows is a 31 y.o. being seen today for   Chief Complaint  Patient presents with   Contraception    Pt in clinic for Depo and desires STI screening for TOC for Trich.  Denies s/sx.     Does the patient have a current or past history of drug use? Yes   No components found for: HCV]   Health Maintenance Due  Topic Date Due   COVID-19 Vaccine (1) Never done   Hepatitis C Screening  Never done   TETANUS/TDAP  Never done   PAP-Cervical Cytology Screening  07/06/2020   INFLUENZA VACCINE  Never done    ROS  The following portions of the patient's history were reviewed and updated as appropriate: allergies, current medications, past family history, past medical history, past social history, past surgical history and problem list. Problem list updated.   See flowsheet for other program required questions.  Objective:  There were no vitals filed for this visit.  Physical Exam Vitals and nursing note reviewed.  Constitutional:      Appearance: Normal appearance.  HENT:     Head: Normocephalic and atraumatic.     Mouth/Throat:     Mouth: Mucous membranes are moist.     Pharynx: Oropharynx is clear. No oropharyngeal exudate or posterior oropharyngeal erythema.  Pulmonary:     Effort: Pulmonary effort is normal.  Abdominal:     General: Abdomen is flat.     Palpations: There is no mass.     Tenderness: There is no abdominal tenderness. There is no rebound.  Genitourinary:    Exam position: Lithotomy position.     Pubic Area: No rash or pubic lice.      Labia:        Right: No rash or lesion.        Left: No rash or lesion.      Vagina: Normal. No vaginal discharge, erythema, bleeding or lesions.     Cervix: No cervical motion tenderness, discharge, friability, lesion or erythema.     Uterus: Normal.      Adnexa: Right adnexa normal and left adnexa normal.      Comments: Deferred, pt self collected  Lymphadenopathy:     Head:     Right side of head: No preauricular or posterior auricular adenopathy.     Left side of head: No preauricular or posterior auricular adenopathy.     Cervical: No cervical adenopathy.     Upper Body:     Right upper body: No supraclavicular or axillary adenopathy.     Left upper body: No supraclavicular or axillary adenopathy.     Lower Body: No right inguinal adenopathy. No left inguinal adenopathy.  Skin:    General: Skin is warm and dry.     Findings: No rash.  Neurological:     Mental Status: She is alert and oriented to person, place, and time.      Assessment and Plan:  Michelle Meadows is a 31 y.o. female presenting to the [facility] Department for a Women's Health problem visit  1. Screening examination for venereal disease Patient accepted all screenings including wet prep, vaginal CT/GC and bloodwork for HIV/RPR.   Wet prep results + trich    Treatment needed  Discussed time line for [employer] results and that patient will be called with positive results and encouraged patient to call if she had not  heard in 2 weeks.  Counseled to return or seek care for continued or worsening symptoms Recommended condom use with all sex  Patient is currently using  depo   to prevent pregnancy.   - Chlamydia/Gonorrhea [facility] - WET PREP FOR TRICH, YEAST, CLUE - Syphilis Serology, [facility] - HIV [facility]   2. Encounter for surveillance of injectable contraceptive Pt is 11 weeks 5 days since last depo Depo given by RN, see RN note.    3. Trichimoniasis Positive trich on wet prep.  Discussed with patient on the importance of treatment.  Pt reports not being able to swallow metronidazole d/t taste.  Discussed listed allergic reaction which was listed as SOB.  Pt denies having SOB.  This reaction removed from allergy list.  Explain to patient that there are limited treatments for this  infection.    - Boric Acid Vaginal 600 MG SUPP; Place 600 mg vaginally 2 (two) times daily for 28 days.  Dispense: 56 suppository; Refill: 0 - tinidazole (TINDAMAX) 500 MG tablet; Take 4 tablets (2,000 mg total) by mouth once for 1 dose.  Dispense: 4 tablet; Refill: 0  Return in about 1 month (around 02/08/2022) for Test of Cure.  No future appointments.  Wendi Snipes, FNP

## 2022-01-14 ENCOUNTER — Telehealth: Payer: Self-pay | Admitting: Family Medicine

## 2022-01-14 ENCOUNTER — Other Ambulatory Visit: Payer: Self-pay | Admitting: Family Medicine

## 2022-01-14 DIAGNOSIS — A599 Trichomoniasis, unspecified: Secondary | ICD-10-CM

## 2022-01-14 MED ORDER — METRONIDAZOLE 375 MG PO CAPS: 375.0000 mg | ORAL_CAPSULE | Freq: Two times a day (BID) | ORAL | 0 refills | Status: AC

## 2022-01-14 NOTE — Progress Notes (Signed)
Pt call to clinic and she is not able to swallow the tinidazole tablets.    Sent RX  to listed pharmacy for  Metronidazole capsules.    - metronidazole (FLAGYL) 375 MG capsule; Take 1 capsule (375 mg total) by mouth 2 (two) times daily for 7 days.  Dispense: 14 capsule; Refill: 0  Verbalizes to patient the importance of needing the treatment for trich.    Wendi Snipes, FNP

## 2022-01-14 NOTE — Telephone Encounter (Signed)
Pt would like to speak to [facility] if possible but if she is not available, she would like to speak to any nurse or provider.  She didn't want to leave any other details.

## 2022-01-23 ENCOUNTER — Emergency Department
Admission: EM | Admit: 2022-01-23 | Discharge: 2022-01-23 | Disposition: A | Discharge status: Home / Self Care | Payer: BC Managed Care – PPO | Attending: Emergency Medicine | Admitting: Emergency Medicine

## 2022-01-23 ENCOUNTER — Other Ambulatory Visit: Payer: Self-pay

## 2022-01-23 DIAGNOSIS — A64 Unspecified sexually transmitted disease: Secondary | ICD-10-CM | POA: Diagnosis present

## 2022-01-23 DIAGNOSIS — Z113 Encounter for screening for infections with a predominantly sexual mode of transmission: Secondary | ICD-10-CM

## 2022-01-23 LAB — URINALYSIS, ROUTINE W REFLEX MICROSCOPIC
Bilirubin Urine: NEGATIVE
Glucose, UA: NEGATIVE mg/dL
Ketones, ur: NEGATIVE mg/dL
Nitrite: NEGATIVE
Protein, ur: NEGATIVE mg/dL
Specific Gravity, Urine: 1.01 (ref 1.005–1.030)
WBC, UA: >50 WBC/hpf — ABNORMAL HIGH (ref 0–5)
pH: 6 (ref 5.0–8.0)

## 2022-01-23 LAB — WET PREP, GENITAL
Clue Cells Wet Prep HPF POC: NONE SEEN
Sperm: NONE SEEN
Trich, Wet Prep: NONE SEEN
WBC, Wet Prep HPF POC: >=10 — AB (ref <10)
Yeast Wet Prep HPF POC: NONE SEEN

## 2022-01-23 LAB — CHLAMYDIA/NGC RT PCR (ARMC ONLY)
Chlamydia Tr: NOT DETECTED
N gonorrhoeae: NOT DETECTED

## 2022-01-23 LAB — POC URINE PREG, ED: Preg Test, Ur: NEGATIVE

## 2022-01-23 NOTE — ED Triage Notes (Signed)
Pt states she has trich and would like medication for same. Pt states the medication she was prescribed at the health department for same she cannot take. Pt denies vaginal pain.  ?

## 2022-01-23 NOTE — ED Provider Notes (Signed)
? ?[facility] ?Provider Note ? ? ? Event Date/Time  ? First MD Initiated Contact with Patient 01/23/22 [phone]   ?  (approximate) ? ? ?History  ? ?std check ? ? ?HPI ? ?Michelle Meadows is a 31 y.o. female who presents to the hospital for STD checking.  Patient denies any vaginal discharge, pelvic pain, dysuria or hematuria.  She reports that she underwent STD testing at the health department 12 days ago which was positive for trichomonas.  She was prescribed Flagyl but she was not able to afford it.  She was then prescribed tinidazole which is cheaper and she was able to get the prescription.  She reports that she has issues swallowing pills and has not started taking it.  She comes in today to get retested to see if she needs to be taking any of these medications. ?  ? ? ?Past Medical History:  ?Diagnosis Date  ? Patient denies medical problems   ? ? ?Past Surgical History:  ?Procedure Laterality Date  ? CHOLECYSTECTOMY    ? ? ? ?Physical Exam  ? ?Triage Vital Signs: ?ED Triage Vitals  ?Enc Vitals Group  ?   BP 01/23/22 0019 122/83  ?   Pulse Rate 01/23/22 0019 82  ?   Resp 01/23/22 0019 18  ?   Temp 01/23/22 0019 99 ?F (37.2 ?C)  ?   Temp Source 01/23/22 0019 Oral  ?   SpO2 01/23/22 0019 98 %  ?   Weight 01/23/22 0019 135 lb (61.2 kg)  ?   Height 01/23/22 0019 5\' 2"  (1.575 m)  ?   Head Circumference --   ?   Peak Flow --   ?   Pain Score 01/23/22 0028 5  ?   Pain Loc --   ?   Pain Edu? --   ?   Excl. in GC? --   ? ? ?Most recent vital signs: ?Vitals:  ? 01/23/22 0019 01/23/22 0220  ?BP: 122/83 116/75  ?Pulse: 82 80  ?Resp: 18 18  ?Temp: 99 ?F (37.2 ?C)   ?SpO2: 98% 100%  ? ? ? ?Constitutional: Alert and oriented. Well appearing and in no apparent distress. ?HEENT: ?     Head: Normocephalic and atraumatic.    ?     Eyes: Conjunctivae are normal. Sclera is non-icteric.  ?     Mouth/Throat: Mucous membranes are moist.  ?     Neck: Supple with no signs of meningismus. ?Cardiovascular: Regular  rate and rhythm.  ?Respiratory: Normal respiratory effort.  ?Gastrointestinal: Soft, non tender. ?Musculoskeletal:  No edema, cyanosis, or erythema of extremities. ?Neurologic: Normal speech and language. Face is symmetric. Moving all extremities. No gross focal neurologic deficits are appreciated. ?Skin: Skin is warm, dry and intact. No rash noted. ?Psychiatric: Mood and affect are normal. Speech and behavior are normal. ? ?ED Results / Procedures / Treatments  ? ?Labs ?(all labs ordered are listed, but only abnormal results are displayed) ?Labs Reviewed  ?WET PREP, GENITAL - Abnormal; Notable for the following components:  ?    Result Value  ? WBC, Wet Prep HPF POC >=10 (*)   ? All other components within normal limits  ?URINALYSIS, ROUTINE W REFLEX MICROSCOPIC - Abnormal; Notable for the following components:  ? Color, Urine YELLOW (*)   ? APPearance HAZY (*)   ? Hgb urine dipstick MODERATE (*)   ? Leukocytes,Ua LARGE (*)   ? WBC, UA >50 (*)   ? Bacteria, UA  RARE (*)   ? All other components within normal limits  ?CHLAMYDIA/NGC RT PCR ([facility] ONLY)            ?URINE CULTURE  ?POC URINE PREG, ED  ? ? ? ?EKG ? ?none ? ? ?RADIOLOGY ?none ? ? ?PROCEDURES: ? ?Critical Care performed: No ? ?Procedures ? ? ? ?IMPRESSION / MDM / ASSESSMENT AND PLAN / ED COURSE  ?I reviewed the triage vital signs and the nursing notes. ? ?31 y.o. female who presents to the hospital for STD checking.  Patient denies any symptoms including pelvic pain, dysuria, or vaginal discharge.  Wants to get checked for STDs.  Vaginal swabs negative for trichomonas, yeast infection, vaginitis, GC and chlamydia.  UA with rare bacteria and large leukocytes but no nitrites.  Patient denies urinary symptoms.  Culture is pending.  We will hold off on any treatment at this time.  Discussed results with patient that are negative at this time she does not need to take any medications at home.  We did discuss safe sex precautions and my standard return  precautions ? ?MEDICATIONS GIVEN IN ED: ?Medications - No data to display ? ? ?EMR reviewed including visit with the health department and results of STD screening done then ? ? ? ?FINAL CLINICAL IMPRESSION(S) / ED DIAGNOSES  ? ?Final diagnoses:  ?Screening examination for STD (sexually transmitted disease)  ? ? ? ?Rx / DC Orders  ? ?ED Discharge Orders   ? ? None  ? ?  ? ? ? ?Note:  This document was prepared using Dragon voice recognition software and may include unintentional dictation errors. ? ? ?Please note:  Patient was evaluated in Emergency Department today for the symptoms described in the history of present illness. Patient was evaluated in the context of the global COVID-19 pandemic, which necessitated consideration that the patient might be at risk for infection with the SARS-CoV-2 virus that causes COVID-19. Institutional protocols and algorithms that pertain to the evaluation of patients at risk for COVID-19 are in a state of rapid change based on information released by regulatory bodies including the CDC and federal and state organizations. These policies and algorithms were followed during the patient's care in the ED.  Some ED evaluations and interventions may be delayed as a result of limited staffing during the pandemic. ? ? ? ? ?  ?Nita Sickle, MD ?01/23/22 [phone] ? ?

## 2022-01-24 LAB — URINE CULTURE: Culture: 2000 — AB

## 2023-01-26 ENCOUNTER — Encounter: Payer: Self-pay | Admitting: Emergency Medicine

## 2023-01-26 ENCOUNTER — Emergency Department
Admission: EM | Admit: 2023-01-26 | Discharge: 2023-01-26 | Disposition: A | Discharge status: Home / Self Care | Payer: BC Managed Care – PPO | Attending: Emergency Medicine | Admitting: Emergency Medicine

## 2023-01-26 DIAGNOSIS — F17210 Nicotine dependence, cigarettes, uncomplicated: Secondary | ICD-10-CM | POA: Insufficient documentation

## 2023-01-26 DIAGNOSIS — N751 Abscess of Bartholin's gland: Secondary | ICD-10-CM | POA: Insufficient documentation

## 2023-01-26 DIAGNOSIS — E669 Obesity, unspecified: Secondary | ICD-10-CM | POA: Diagnosis not present

## 2023-01-26 LAB — URINALYSIS, ROUTINE W REFLEX MICROSCOPIC
Bacteria, UA: NONE SEEN
Bilirubin Urine: NEGATIVE
Glucose, UA: NEGATIVE mg/dL
Ketones, ur: NEGATIVE mg/dL
Nitrite: NEGATIVE
Protein, ur: 30 mg/dL — AB
Specific Gravity, Urine: 1.031 — ABNORMAL HIGH (ref 1.005–1.030)
pH: 5 (ref 5.0–8.0)

## 2023-01-26 LAB — WET PREP, GENITAL
Clue Cells Wet Prep HPF POC: NONE SEEN
Sperm: NONE SEEN
Trich, Wet Prep: NONE SEEN
WBC, Wet Prep HPF POC: <10 (ref <10)
Yeast Wet Prep HPF POC: NONE SEEN

## 2023-01-26 LAB — CHLAMYDIA/NGC RT PCR (ARMC ONLY)
Chlamydia Tr: NOT DETECTED
N gonorrhoeae: NOT DETECTED

## 2023-01-26 LAB — POC URINE PREG, ED: Preg Test, Ur: NEGATIVE

## 2023-01-26 MED ORDER — LIDOCAINE-PRILOCAINE 2.5-2.5 % EX CREA
TOPICAL_CREAM | Freq: Once | CUTANEOUS | Status: AC
  Filled 2023-01-26: qty 5

## 2023-01-26 MED ORDER — LIDOCAINE-EPINEPHRINE 2 %-1:100000 IJ SOLN
20.0000 mL | Freq: Once | INTRAMUSCULAR | Status: AC
Start: 2023-01-26 — End: 2023-01-26
  Administered 2023-01-26: 20 mL
  Filled 2023-01-26: qty 1

## 2023-01-26 MED ORDER — DOXYCYCLINE MONOHYDRATE 100 MG PO TABS: 100.0000 mg | ORAL_TABLET | Freq: Two times a day (BID) | ORAL | 0 refills | Status: AC

## 2023-01-26 MED ORDER — AMOXICILLIN-POT CLAVULANATE 875-125 MG PO TABS: 1.0000 | ORAL_TABLET | Freq: Two times a day (BID) | ORAL | 0 refills | Status: AC

## 2023-01-26 NOTE — ED Provider Notes (Signed)
[facility] Provider Note    Event Date/Time   First MD Initiated Contact with Patient 01/26/23 1132     (approximate)   History   Abscess   HPI  Michelle Meadows is a 32 y.o. female with no significant past medical history presents with a area of swelling on the vagina and pain.  Patient notes that 2 days ago she noticed what she thought could be a cyst on the labia.  It is painful.  She also has had vaginal discharge but no abdominal or pelvic pain no pain with intercourse.  She is concerned she could have an STD.  Has had trichomonas in the past, but did not take the metronidazole because of the taste of the pill.     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      Excl. in [place]?     Most recent vital signs: Vitals:   01/26/23 1044  BP: 123/79  Pulse: (!) 101  Resp: 16  Temp: 98.7 F (37.1 C)  SpO2: 98%     General: Awake, no distress.  CV:  Good peripheral perfusion.  Resp:  Normal effort.  Abd:  No distention.  Neuro:             Awake, Alert, Oriented x 3  Other:  Fluctuance and swelling about the size of Allmond at the 6 o'clock position of the labia minora consistent with Bartholin gland abscess no perineal swelling or crepitus   ED Results / Procedures / Treatments  Labs (all labs ordered are  listed, but only abnormal results are displayed) Labs Reviewed  URINALYSIS, ROUTINE W REFLEX MICROSCOPIC - Abnormal; Notable for the following components:      Result Value   Color, Urine AMBER (*)    APPearance CLOUDY (*)    Specific Gravity, Urine 1.031 (*)    Hgb urine dipstick MODERATE (*)    Protein, ur 30 (*)    Leukocytes,Ua LARGE (*)    All other components within normal limits  WET PREP, GENITAL  CHLAMYDIA/NGC RT PCR ([facility] ONLY)            POC URINE PREG, ED     EKG     RADIOLOGY    PROCEDURES:  Critical Care performed: No  Procedures   MEDICATIONS ORDERED IN ED: Medications  lidocaine-prilocaine (EMLA) cream ( Topical Given by Other 01/26/23 1229)  lidocaine-EPINEPHrine (XYLOCAINE W/EPI) 2 %-1:100000 (with pres) injection 20 mL (20 mLs Infiltration Given by Other 01/26/23 1229)  IMPRESSION / MDM / ASSESSMENT AND PLAN / ED COURSE  I reviewed the triage vital signs and the nursing notes.                              Patient's presentation is most consistent with acute, uncomplicated illness.  Differential diagnosis includes, but is not limited to, Bartholin gland cyst, Bartholin gland abscess, vulvar abscess, gonorrhea, chlamydia, trichomonas, less likely PID  The patient is a 32 year old female who presents because of swelling and pain on the labia.  On exam she has what appears to be a small Bartholin abscess.  There is no significant surrounding cellulitis no findings to suggest a necrotizing infection.  Patient is also had vaginal discharge and would like to be tested for STDs.  She does not want to be tested for HIV and syphilis just for trichomonas gonorrhea and chlamydia.  I applied, to the Bartholin abscess in preparation to perform I&D.  When I went to then anesthetized with lidocaine the abscess had ruptured spontaneously and I was able to express a significant amount of purulence until it was completely decompressed thus avoiding I&D.  Will  prescribe 5 days of Augmentin and doxycycline for the abscess.  Patient self swab for wet prep and GC chlamydia.  Wet prep is negative.  Patient does not want treatment for GC chlamydia.  This is pending at the time of discharge.       FINAL CLINICAL IMPRESSION(S) / ED DIAGNOSES   Final diagnoses:  Bartholin's gland abscess     Rx / DC Orders   ED Discharge Orders          Ordered    doxycycline (ADOXA) 100 MG tablet  2 times daily        01/26/23 1311    amoxicillin-clavulanate (AUGMENTIN) 875-125 MG tablet  2 times daily        01/26/23 1311             Note:  This document was prepared using Dragon voice recognition software and may include unintentional dictation errors.   Rada Hay, MD 01/26/23 [phone]

## 2023-01-26 NOTE — ED Triage Notes (Signed)
Pt reports possible boil or cyst in vaginal area since Sunday. Also requesting STD testing due to odor.

## 2023-01-26 NOTE — ED Notes (Signed)
Pt given swabs for self vaginal swab.

## 2023-01-26 NOTE — ED Notes (Signed)
Pt discharge to home. Pt VSS, GCS 15, NAD. Pt verbalized understanding of discharge instructions with no additional questions at this time.

## 2023-01-26 NOTE — Discharge Instructions (Addendum)
You had a Bartholin gland abscess.  Please take the 2 antibiotics twice a day for the next 5 days.  Please soak the area 2-3 times per day.  If the pain is worsening or you have recurrent swelling please return to the emergency department.  The gonorrhea and Chlamydia test was pending at the time of your discharge.  If it turns back positive you will receive a phone call.

## 2023-03-25 ENCOUNTER — Other Ambulatory Visit: Payer: Self-pay

## 2023-03-25 ENCOUNTER — Emergency Department
Admission: EM | Admit: 2023-03-25 | Discharge: 2023-03-25 | Disposition: A | Discharge status: Home / Self Care | Payer: BC Managed Care – PPO | Attending: Emergency Medicine | Admitting: Emergency Medicine

## 2023-03-25 ENCOUNTER — Encounter: Payer: Self-pay | Admitting: Emergency Medicine

## 2023-03-25 ENCOUNTER — Emergency Department: Payer: BC Managed Care – PPO

## 2023-03-25 DIAGNOSIS — Y9389 Activity, other specified: Secondary | ICD-10-CM | POA: Diagnosis not present

## 2023-03-25 DIAGNOSIS — M25561 Pain in right knee: Secondary | ICD-10-CM | POA: Diagnosis present

## 2023-03-25 DIAGNOSIS — M7041 Prepatellar bursitis, right knee: Secondary | ICD-10-CM | POA: Insufficient documentation

## 2023-03-25 DIAGNOSIS — M25461 Effusion, right knee: Secondary | ICD-10-CM

## 2023-03-25 MED ORDER — MELOXICAM 15 MG PO TABS: 15.0000 mg | ORAL_TABLET | Freq: Every day | ORAL | 2 refills | Status: AC

## 2023-03-25 NOTE — ED Provider Notes (Signed)
[facility] Provider Note    Event Date/Time   First MD Initiated Contact with Patient 03/25/23 [phone]     (approximate)   History   Knee Pain   HPI  Michelle Meadows is a 32 y.o. female with no significant past medical history presents emergency department with right knee pain.  No known injury.  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  Having pain since Sunday.  States now she feels like she cannot completely extend the knee.  No numbness or tingling.  No fever or chills.  No redness.  Has not taken anything for pain      Physical Exam   Triage Vital Signs: ED Triage Vitals  Enc Vitals Group     BP 03/25/23 0641 114/83     Pulse Rate 03/25/23 0641 70     Resp 03/25/23 0641 17     Temp 03/25/23 0641 98.4 F (36.9 C)     Temp Source 03/25/23 0641 Oral     SpO2 03/25/23 0641 100 %     Weight 03/25/23 0639 150 lb (68 kg)     Height 03/25/23 0639 5\' 2"  (1.575 m)     Head Circumference --      Peak Flow --      Pain Score 03/25/23 0639 6     Pain Loc --      Pain Edu? --      Excl. in [place]? --     Most recent vital signs: Vitals:   03/25/23 0641  BP: 114/83  Pulse: 70  Resp: 17  Temp: 98.4 F (36.9 C)  SpO2: 100%     General: Awake, no distress.   CV:  Good peripheral perfusion. regular rate and  rhythm Resp:  Normal effort.  Abd:  No distention.   Other:  Right knee is tender at the joint line and bursa, patient can extend the leg, is able to flex the knee, although this does reproduce pain, neurovascular appears to be intact   ED Results / Procedures / Treatments   Labs (all labs ordered are listed, but only abnormal results are displayed) Labs Reviewed - No data to display   EKG     RADIOLOGY X-ray of the right knee    PROCEDURES:   Procedures   MEDICATIONS ORDERED IN ED: Medications - No data to display   IMPRESSION / MDM / ASSESSMENT AND PLAN / ED COURSE  I reviewed the triage vital  signs and the nursing notes.                              Differential diagnosis includes, but is not limited to, septic joint, knee effusion, bursitis  Patient's presentation is most consistent with acute complicated illness / injury requiring diagnostic workup.   X-ray of the right knee was independently reviewed and interpreted by me as having a very small knee effusion, on physical exam patient does have difficulty extending the knee however area at the joint line is minimally tender, there is no redness or swelling feel that a septic joint is less likely.  I placed the patient in a knee brace.  She is to take meloxicam as she has not taken any medications for pain.   She is to apply ice to the knee.  Wear the knee brace.  Follow-up with orthopedics if not improving in 3 to 4 days.  Return emergency  department worsening.  She is in agreement treatment plan.  Was given a work note through Monday.  Discharged stable condition.   FINAL CLINICAL IMPRESSION(S) / ED DIAGNOSES   Final diagnoses:  Prepatellar bursitis of right knee  Knee effusion, right     Rx / DC Orders   ED Discharge Orders          Ordered    meloxicam (MOBIC) 15 MG tablet  Daily        03/25/23 0715             Note:  This document was prepared using Dragon voice recognition software and may include unintentional dictation errors.    Faythe Ghee, PA-C 03/25/23 3086    Sharman Cheek, MD 03/25/23 [phone]

## 2023-03-25 NOTE — ED Triage Notes (Signed)
Pt to triage via w/c with no distress noted; pt reports since Sunday having pain/swelling in rt knee with no known injury

## 2023-03-25 NOTE — Discharge Instructions (Addendum)
Follow-up with your regular doctor as needed.  Follow-up with orthopedics if not improving to 3 days.  Return emergency department if worsening

## 2023-04-14 ENCOUNTER — Ambulatory Visit: Payer: BC Managed Care – PPO

## 2023-12-05 ENCOUNTER — Other Ambulatory Visit: Payer: Self-pay

## 2023-12-05 ENCOUNTER — Encounter: Payer: Self-pay | Admitting: Emergency Medicine

## 2023-12-05 ENCOUNTER — Emergency Department
Admission: EM | Admit: 2023-12-05 | Discharge: 2023-12-05 | Disposition: A | Discharge status: Home / Self Care | Payer: BC Managed Care – PPO | Attending: Emergency Medicine | Admitting: Emergency Medicine

## 2023-12-05 ENCOUNTER — Emergency Department: Payer: BC Managed Care – PPO

## 2023-12-05 DIAGNOSIS — Z3A01 Less than 8 weeks gestation of pregnancy: Secondary | ICD-10-CM | POA: Diagnosis not present

## 2023-12-05 DIAGNOSIS — R000 Tachycardia, unspecified: Secondary | ICD-10-CM | POA: Diagnosis not present

## 2023-12-05 DIAGNOSIS — O26891 Other specified pregnancy related conditions, first trimester: Secondary | ICD-10-CM | POA: Diagnosis not present

## 2023-12-05 DIAGNOSIS — O99411 Diseases of the circulatory system complicating pregnancy, first trimester: Secondary | ICD-10-CM | POA: Diagnosis not present

## 2023-12-05 DIAGNOSIS — O209 Hemorrhage in early pregnancy, unspecified: Secondary | ICD-10-CM | POA: Diagnosis present

## 2023-12-05 DIAGNOSIS — R1032 Left lower quadrant pain: Secondary | ICD-10-CM | POA: Diagnosis not present

## 2023-12-05 DIAGNOSIS — O009 Unspecified ectopic pregnancy without intrauterine pregnancy: Secondary | ICD-10-CM

## 2023-12-05 LAB — URINALYSIS, W/ REFLEX TO CULTURE (INFECTION SUSPECTED)
Bilirubin Urine: NEGATIVE
Glucose, UA: NEGATIVE mg/dL
Ketones, ur: 5 mg/dL — AB
Nitrite: POSITIVE — AB
Protein, ur: NEGATIVE mg/dL
Specific Gravity, Urine: 1.01 (ref 1.005–1.030)
pH: 5 (ref 5.0–8.0)

## 2023-12-05 LAB — BASIC METABOLIC PANEL
Anion gap: 9 (ref 5–15)
BUN: 10 mg/dL (ref 6–20)
CO2: 25 mmol/L (ref 22–32)
Calcium: 9.1 mg/dL (ref 8.9–10.3)
Chloride: 102 mmol/L (ref 98–111)
Creatinine, Ser: 0.61 mg/dL (ref 0.44–1.00)
GFR, Estimated: >60 mL/min (ref >60)
Glucose, Bld: 106 mg/dL — ABNORMAL HIGH (ref 70–99)
Potassium: 3.8 mmol/L (ref 3.5–5.1)
Sodium: 136 mmol/L (ref 135–145)

## 2023-12-05 LAB — WET PREP, GENITAL
Sperm: NONE SEEN
Trich, Wet Prep: NONE SEEN
WBC, Wet Prep HPF POC: <10 (ref <10)
Yeast Wet Prep HPF POC: NONE SEEN

## 2023-12-05 LAB — POC URINE PREG, ED: Preg Test, Ur: POSITIVE — AB

## 2023-12-05 LAB — CHLAMYDIA/NGC RT PCR (ARMC ONLY)
Chlamydia Tr: NOT DETECTED
N gonorrhoeae: NOT DETECTED

## 2023-12-05 LAB — CBC
HCT: 36.9 % (ref 36.0–46.0)
Hemoglobin: 12.4 g/dL (ref 12.0–15.0)
MCH: 27.9 pg (ref 26.0–34.0)
MCHC: 33.6 g/dL (ref 30.0–36.0)
MCV: 83.1 fL (ref 80.0–100.0)
Platelets: 214 10*3/uL (ref 150–400)
RBC: 4.44 MIL/uL (ref 3.87–5.11)
RDW: 12.8 % (ref 11.5–15.5)
WBC: 7.1 10*3/uL (ref 4.0–10.5)
nRBC: 0 % (ref 0.0–0.2)

## 2023-12-05 LAB — ABO/RH: ABO/RH(D): B POS

## 2023-12-05 LAB — HCG, QUANTITATIVE, PREGNANCY: hCG, Beta Chain, Quant, S: 2541 m[IU]/mL — ABNORMAL HIGH (ref <5)

## 2023-12-05 MED ORDER — NITROFURANTOIN MONOHYD MACRO 100 MG PO CAPS: 100.0000 mg | ORAL_CAPSULE | Freq: Two times a day (BID) | ORAL | 0 refills | Status: AC

## 2023-12-05 MED ORDER — ACETAMINOPHEN 500 MG PO TABS
1000.0000 mg | ORAL_TABLET | Freq: Once | ORAL | Status: AC
  Administered 2023-12-05: 1000 mg via ORAL
  Filled 2023-12-05: qty 2

## 2023-12-05 MED ORDER — CLINDAMYCIN HCL 300 MG PO CAPS: 300.0000 mg | ORAL_CAPSULE | Freq: Three times a day (TID) | ORAL | 0 refills | Status: AC

## 2023-12-05 MED ORDER — METHOTREXATE FOR ECTOPIC PREGNANCY
50.0000 mg/m2 | INTRAMUSCULAR | Status: AC
  Administered 2023-12-05: 85 mg via INTRAMUSCULAR
  Filled 2023-12-05: qty 3.4

## 2023-12-05 NOTE — ED Triage Notes (Signed)
 Abdominal pain since Friday.  + home pregnancy test on Friday.  LMP:  10/13/2023.  Also vaginal bleeding since December. Bleeding has been intermittent and light.  C/O Left Lower abd pain.

## 2023-12-05 NOTE — ED Provider Notes (Signed)
-----------------------------------------   3:54 PM on 12/05/2023 ----------------------------------------- Patient care assumed from Dr. Levander.  Ultrasound concerning for right adnexal ectopic pregnancy.  I spoke with Dr. Leonce of OB/GYN who will be down to see the patient and discussed options.  Patient is in no distress.  I have updated the patient on the ultrasound findings.  Hemodynamically stable.  OB has seen and evaluated the patient.  They will be proceeding with methotrexate  treatment.  Once the patient receives her methotrexate  we will discharge from the emergency department.  Patient has follow-up scheduled with OB.  Patient agreeable to plan of care.   Dorothyann Drivers, MD 12/05/23 1807

## 2023-12-05 NOTE — ED Provider Notes (Signed)
 [facility] Provider Note    Event Date/Time   First MD Initiated Contact with Patient 12/05/23 1243     (approximate)   History   Abdominal Pain   HPI  Michelle Meadows is a 33 year old female G2, P1 presenting to the emergency department for evaluation of abdominal pain and vaginal bleeding.  LMP 10/13/2023.  Has been having some ongoing vaginal bleeding, more than spotting, less than her menstrual cycle.  Today, had onset of lower abdominal pain leading her to present to the ER.  Did have a positive home pregnancy test on Friday.  No fevers or chills.     Physical Exam   Triage Vital Signs: ED Triage Vitals [12/05/23 1145]  Encounter Vitals Group     BP 122/78     Systolic BP Percentile      Diastolic BP Percentile      Pulse Rate (!) 101     Resp 16     Temp 97.9 F (36.6 C)     Temp Source Oral     SpO2 100 %     Weight 149 lb 14.6 oz (68 kg)     Height      Head Circumference      Peak Flow      Pain Score 8     Pain Loc      Pain Education      Exclude from Growth Chart     Most recent vital signs: Vitals:   12/05/23 1145  BP: 122/78  Pulse: (!) 101  Resp: 16  Temp: 97.9 F (36.6 C)  SpO2: 100%     General: Awake, interactive  CV:  Mild tachycardia, good peripheral perfusion  Resp:  Unlabored respirations, lungs clear to auscultation Abd:  Nondistended, soft, tender palpation most notably over the suprapubic region without rebound or guarding Neuro:  Symmetric facial movement, fluid speech   ED Results / Procedures / Treatments   Labs (all labs ordered are listed, but only abnormal results are displayed) Labs Reviewed  WET PREP, GENITAL - Abnormal; Notable for the following components:      Result Value   Clue Cells Wet Prep HPF POC PRESENT (*)    All other components within normal limits  HCG, QUANTITATIVE, PREGNANCY - Abnormal; Notable for the following components:   hCG, Beta Chain, Quant, S 2,541 (*)     All other components within normal limits  BASIC METABOLIC PANEL - Abnormal; Notable for the following components:   Glucose, Bld 106 (*)    All other components within normal limits  URINALYSIS, W/ REFLEX TO CULTURE (INFECTION SUSPECTED) - Abnormal; Notable for the following components:   Color, Urine YELLOW (*)    APPearance CLOUDY (*)    Hgb urine dipstick LARGE (*)    Ketones, ur 5 (*)    Nitrite POSITIVE (*)    Leukocytes,Ua MODERATE (*)    Bacteria, UA MANY (*)    All other components within normal limits  POC URINE PREG, ED - Abnormal; Notable for the following components:   Preg Test, Ur POSITIVE (*)    All other components within normal limits  CHLAMYDIA/NGC RT PCR ([facility] ONLY)            CBC  ABO/RH     EKG EKG independently reviewed interpreted by myself (ER attending) demonstrates:    RADIOLOGY Imaging independently reviewed and interpreted by myself demonstrates:  US  formal read pending at the time of my review, did receive  a call from radiology that ultrasound concerning for possible right sided ectopic pregnancy  PROCEDURES:  Critical Care performed: No  Procedures   MEDICATIONS ORDERED IN ED: Medications  acetaminophen  (TYLENOL ) tablet 1,000 mg (1,000 mg Oral Given 12/05/23 1333)     IMPRESSION / MDM / ASSESSMENT AND PLAN / ED COURSE  I reviewed the triage vital signs and the nursing notes.  Differential diagnosis includes, but is not limited to, threatened abortion, subchorionic hemorrhage, completed miscarriage, STD, UTI  Patient's presentation is most consistent with acute presentation with potential threat to life or bodily function.  33 year old female presenting with abdominal pain in the setting of recent positive pregnancy test.  Mild tachycardia on presentation here.  Labs with reassuring CBC and BMP.  hCG elevated at 2500.  Wet prep does demonstrate clue cells.  Urine is concerning for infection with 21-50 white blood cells, many bacteria,  nitrite positive.  Rh+, no indication for RhoGAM.  Ultrasound ordered to further evaluate.  If this demonstrates IUP and patient's pain is controlled, suspect she will be stable for discharge with antibiotics for her UTI and outpatient follow-up with OB/GYN.  Signed out to oncoming physician pending ultrasound read and disposition.  3:13 PM Shortly after signout, I did receive a call from radiology that the patient's ultrasound was concerning for possible ectopic pregnancy with right sided adnexal mass without evidence of rupture.  No gestational sac or fetal pole noted. Oncoming physician aware and will contact OBGYN.       FINAL CLINICAL IMPRESSION(S) / ED DIAGNOSES   Final diagnoses:  LLQ pain  Vaginal bleeding affecting early pregnancy     Rx / DC Orders   ED Discharge Orders     None        Note:  This document was prepared using Dragon voice recognition software and may include unintentional dictation errors.   Levander Slate, MD 12/05/23 [phone]

## 2023-12-05 NOTE — Consult Note (Signed)
 GYNECOLOGY CONSULT NOTE  GYN Consultation  Attending Provider: Dorothyann Drivers, MD   Michelle Meadows [id number] 12/05/2023 6:20 PM    Reason for Consultation:   Michelle Meadows is a 33 y.o. G2P1001 premenopausal female seen at the request of Dorothyann Drivers, MD,  for evaluation of suspected ectopic pregnancy.    History of Present Ilness:   33 y.o. G71P1001 female who presents to the ER with vaginal bleeding x 2 weeks and worsening abdominal cramping 3 days.  Patient's last menstrual period was 10/13/2023.  She notes that her periods are regular.  She found out she was pregnant 3 days ago.  Her bleeding was a little heavier than spotting and less than a period.  Friday she had pain that was 8/10.  So, she rested and the pain got better.  Today, she went back to work and today she had pain that was again 8/10. So, she came to the ER. She was given Tylenol  and her pain is much better and she currently rates the pain as 3/10.  She denies a history of STI, PID, prior ectopic pregnancy. This pregnancy was unplanned.  Her Rh type is positive.    Past Medical History:  Diagnosis Date   Patient denies medical problems    Past Surgical History:  Procedure Laterality Date   CHOLECYSTECTOMY     Allergies  Allergen Reactions   Metronidazole  Nausea And Vomiting    Pt denies that metronidazole  causes SOB, that she can not swallow it because of the taste.    Prior to Admission medications   Medication Sig Start Date End Date Taking? Authorizing Provider  meloxicam  (MOBIC ) 15 MG tablet Take 1 tablet (15 mg total) by mouth daily. 03/25/23 03/24/24  Gasper Devere ORN, PA-C    Obstetric History: She is a G44P1001 female s/p SVD x 1.    Social History:  She  reports that she has been smoking cigarettes. She has a 4 pack-year smoking history. She has never used smokeless tobacco. She reports that she does not currently use alcohol. She reports that she does not currently use drugs after having  used the following drugs: Marijuana.  Family History:  family history includes Bone cancer in her maternal grandmother; Diabetes in her mother and sister; Heart disease in her maternal grandfather; Hypertension in her maternal grandfather and mother; Lupus in her maternal grandmother; Sickle cell trait in her sister.   Review of Systems  Constitutional: Negative.   HENT: Negative.    Eyes: Negative.   Respiratory: Negative.    Cardiovascular: Negative.   Gastrointestinal:  Positive for abdominal pain (mild currently, lower abdomen, but R>L). Negative for blood in stool, constipation, diarrhea, heartburn, melena, nausea and vomiting.  Genitourinary: Negative.        See HPI  Musculoskeletal: Negative.   Skin: Negative.   Neurological: Negative.   Psychiatric/Behavioral: Negative.       Objective    BP 122/73   Pulse 74   Temp 97.9 F (36.6 C) (Oral)   Resp 20   Ht 5' 2.01 (1.575 m)   Wt 68 kg   LMP 10/13/2023   SpO2 100%   BMI 27.41 kg/m  Physical Exam Constitutional:      General: She is not in acute distress.    Appearance: Normal appearance. She is well-developed.  HENT:     Head: Normocephalic and atraumatic.  Eyes:     General: No scleral icterus.    Conjunctiva/sclera: Conjunctivae normal.  Cardiovascular:  Rate and Rhythm: Normal rate and regular rhythm.     Heart sounds: No murmur heard.    No friction rub. No gallop.  Pulmonary:     Effort: Pulmonary effort is normal. No respiratory distress.     Breath sounds: Normal breath sounds. No wheezing or rales.  Abdominal:     General: Bowel sounds are normal. There is no distension.     Palpations: Abdomen is soft. There is no mass.     Tenderness: There is no abdominal tenderness. There is no guarding or rebound.  Musculoskeletal:        General: Normal range of motion.     Cervical back: Normal range of motion and neck supple.  Neurological:     General: No focal deficit present.     Mental Status: She  is alert and oriented to person, place, and time.     Cranial Nerves: No cranial nerve deficit.  Skin:    General: Skin is warm and dry.     Findings: No erythema.  Psychiatric:        Mood and Affect: Mood normal.        Behavior: Behavior normal.        Judgment: Judgment normal.      Laboratory Results:   Lab Results  Component Value Date   WBC 7.1 12/05/2023   RBC 4.44 12/05/2023   HGB 12.4 12/05/2023   HCT 36.9 12/05/2023   PLT 214 12/05/2023   NA 136 12/05/2023   K 3.8 12/05/2023   CREATININE 0.61 12/05/2023   Lab Results  Component Value Date   PREGTESTUR POSITIVE (A) 12/05/2023   Lab Results  Component Value Date   HCGBETAQNT 2,541 (H) 12/05/2023    Imaging Results US  OB LESS THAN 14 WEEKS WITH OB TRANSVAGINAL Result Date: 12/05/2023 CLINICAL DATA:  Left lower quadrant abdominal pain. Vaginal bleeding during pregnancy. LMP: 10/13/2023 corresponding to an estimated gestational age of [redacted] weeks, 4 days. EXAM: OBSTETRIC <14 WK US  AND TRANSVAGINAL OB US  TECHNIQUE: Both transabdominal and transvaginal ultrasound examinations were performed for complete evaluation of the gestation as well as the maternal uterus, adnexal regions, and pelvic cul-de-sac. Transvaginal technique was performed to assess early pregnancy. COMPARISON:  None Available. FINDINGS: The uterus is retroverted and appears unremarkable. The endometrium is unremarkable and measures approximately 5 mm in thickness. No intrauterine pregnancy identified. The ovaries are unremarkable. There is a 2.5 x 1.6 cm solid ovoid mass adjacent to the right ovary suspicious for an ectopic pregnancy, possibly in the fallopian tube. No gestational sac or fetal pole identified Small simple appearing fluid in the pelvis. IMPRESSION: 1. No intrauterine pregnancy identified. 2. Findings suspicious for a right adnexal ectopic pregnancy. Obstetrical consult is advised. These results were called by telephone at the time of interpretation  on 12/05/2023 at 3:10 pm to provider NEHA RAY , who verbally acknowledged these results. Electronically Signed   By: Vanetta Chou M.D.   On: 12/05/2023 15:12     Assessment & Recommendations   Michelle Meadows is a 33 y.o. G2P1001 premenopausal female with a likely ectopic pregnancy being seen in consultation.  Differential diagnosis includes normal pregnancy, failing intrauterine pregnancy, and ectopic pregnancy.  I favor ectopic pregnancy.  Her endometrium does not have the appearance of pregnancy. She is in the range of numbers where an intrauterine pregnancy should be seen on ultrasound and hers was not. She has a lesion on her right that could very well be an ectopic pregnancy.  A long discussion was had regarding all the findings. We discuss that it would be highly unlikely that her current pregnancy is normal, especially given a 2-week bleeding history and that she should be over [redacted] weeks pregnant with normal and regular periods.  She also has no findings of an intrauterine pregnancy on ultrasound.  The other possibilities include a failing intrauterine pregnancy. However, her endometrium on ultrasound does not show the typical changes with intrauterine pregnancy. The bleeding would support a failed pregnancy. The most likely diagnosis by far is ectopic pregnancy. We discussed treatment options, which include; do nothing (not recommended), treatment with methotrexate  given her clinical stability and willingness to follow up.  We also discussed surgery. She is not acute now and she meets criteria for methotrexate . She readily agrees to proceed with methotrexate . I personally discussed that there is risk with taking methotrexate  and that she could still need emergency surgery, a blood transfusion, etc.  We dicussed side effects of methotrexate  (both common and rare) and the recommended follow up, which she agreed to attend.  The standard protocol will be followed and she will received the standard  dose of methotrexate  today.  All questions answered.   Recommendations:  1.  Methotrexate  50 mg/m^2 BSA.   2.  Follow up on day 4 and 7 for hCG value checking.  Discussed that we expect a 15% drop from days 4-7 and she would need weekly hCG values until this goes to zero.    She voiced understanding and agreement to the above.  A total of 65 minutes were spent face-to-face with the patient as well as preparation, review, communication, and documentation during this encounter.    Garnette Mace, MD 12/05/2023 6:20 PM

## 2023-12-05 NOTE — ED Notes (Signed)
 L&D RN Victorino Dike. D called to assist in medication admin.

## 2023-12-05 NOTE — ED Provider Triage Note (Signed)
 Emergency Medicine Provider Triage Evaluation Note  Michelle Meadows , a 33 y.o. female  was evaluated in triage.  Pt complains of lower abdominal pain that began on Friday and also had a positive pregnancy test on Friday. No history of miscarriage or ectopic pregnancy. Also having some vaginal bleeding but describes it as less than a normal period. Has been having the bleeding for a couple weeks.   Review of Systems  Positive: Lower abdominal pain, vaginal bleeding Negative: N/V/D  Physical Exam  BP 122/78 (BP Location: Left Arm)   Pulse (!) 101   Temp 97.9 F (36.6 C) (Oral)   Resp 16   Wt 68 kg   LMP 10/13/2023   SpO2 100%   BMI 27.42 kg/m  Gen:   Awake, no distress   Resp:  Normal effort  MSK:   Moves extremities without difficulty  Other:    Medical Decision Making  Medically screening exam initiated at 12:36 PM.  Appropriate orders placed.  Michelle Meadows was informed that the remainder of the evaluation will be completed by another provider, this initial triage assessment does not replace that evaluation, and the importance of remaining in the ED until their evaluation is complete.    Cleaster Tinnie LABOR, PA-C 12/05/23 1241

## 2023-12-05 NOTE — Discharge Instructions (Addendum)
 [email or web address]  Call us  right away if you: Have severe abdominal pain that keeps getting worse Are bleeding enough to soak a maxi pad each hour for 2 hours straight What is methotrexate ? It is a medicine that can be used to treat an ectopic pregnancy. It stops certain cells from dividing by interfering with the folic acid in your body.    It is given by injection. You often only need 1 dose. Sometimes people will need a second dose to fully treat the ectopic pregnancy.    You will need to have blood tests to check the level of pregnancy hormone (hCG). You will get your blood drawn 3 separate times in the first week.  Based on these results we will let you know if you need a second dose.  Then you will need your blood drawn once a week until your hCG (pregnancy hormone) is negative.   What are the benefits? Not every person with an ectopic pregnancy can be treated with methotrexate . There is a high chance of success if it can be used. This means you will not need surgery and the fallopian tube with the ectopic can stay in your body. Your provider will talk with you about your success rates. About 9 out of 10 people's tubes stay open after treatment.   How often will I need follow up? Day 1. You will have your hCG level, blood type and other tests done. You will talk with your provider. You will get the methotrexate  injection. You will get Rhogam if you have a negative blood type. Day 4. You will have your hCG level drawn. Your hCG may rise on this day. This is normal. Day 7. You will have several labs drawn, including hCG. You will see your provider to talk about your lab results and symptoms. Your hCG should drop by at least 15% from your day 4 level. You will get your hCG level drawn every 7 days until your blood pregnancy test is negative. Very important instructions for you to follow:  Do not: Do not take any folic acid or prenatal  vitamins. Stop taking all vitamins and supplements unless directed by your provider. This can be separately or in your multivitamin or prenatal vitamin. These lower the success of the methotrexate . Do not take any form of progesterone. Do not take any non-steroidal anti-inflammatory medicine for the next 10 days, such as: Advil Aleve Ibuprofen Motrin Orudis  Do not lay in the sun or a tanning bed for at least 7 days.  Do not drink alcohol until your hCG level is negative.  Do not have sexual intercourse for at least 2 weeks after your last injection. Talk with your doctor before having sex again. Use condoms or other birth control until your hCG is negative and you want to try to get pregnant again.   It is best to wait 3 months after methotrexate  injection to try to get pregnant again. Talk with your provider about future pregnancy plans.    What happens after I get methotrexate ? Mild to moderate cramping for 3 to 7 days Take your pain medicine as prescribed. Light vaginal bleeding Your period may not be normal. It may last longer than normal. You may pass some gray-pink tissue called a "cast" from the uterus. This is normal. What are the side effects? Common side effects are: Feeling tired Abdominal pain or cramping Nausea and/or vomiting for 24 hours Upset stomach, decreased appetite Diarrhea Sores in your mouth Headache Redness, swelling, or  pain at the injection site Having trouble sleeping Rare side effects are: Hair loss Bone marrow suppression Pneumonitis (inflammation of the lungs) Pleuritis High liver enzymes Dermatitis

## 2023-12-05 NOTE — ED Notes (Signed)
 Pt unable to provide urine sample at this time

## 2023-12-05 NOTE — ED Notes (Signed)
 Pt states pain is lower ABD that feels like a severe cramp and radiates to her bottom.

## 2023-12-07 ENCOUNTER — Other Ambulatory Visit: Payer: Self-pay

## 2023-12-07 ENCOUNTER — Encounter: Payer: Self-pay | Admitting: Emergency Medicine

## 2023-12-07 DIAGNOSIS — O26891 Other specified pregnancy related conditions, first trimester: Secondary | ICD-10-CM | POA: Diagnosis present

## 2023-12-07 DIAGNOSIS — Z3A01 Less than 8 weeks gestation of pregnancy: Secondary | ICD-10-CM | POA: Insufficient documentation

## 2023-12-07 DIAGNOSIS — O00101 Right tubal pregnancy without intrauterine pregnancy: Secondary | ICD-10-CM | POA: Insufficient documentation

## 2023-12-07 NOTE — ED Triage Notes (Signed)
 Patient wheeled to triage with complaints of continued abdominal pain and scant bleeding. Patient was seen on Monday and diagnosed with ectopic pregnancy. States she was given a shot and discharged. Here for worsening pain.

## 2023-12-08 ENCOUNTER — Emergency Department: Payer: BC Managed Care – PPO

## 2023-12-08 ENCOUNTER — Emergency Department
Admission: EM | Admit: 2023-12-08 | Discharge: 2023-12-08 | Disposition: A | Discharge status: Home / Self Care | Payer: BC Managed Care – PPO | Attending: Emergency Medicine | Admitting: Emergency Medicine

## 2023-12-08 DIAGNOSIS — O00101 Right tubal pregnancy without intrauterine pregnancy: Secondary | ICD-10-CM

## 2023-12-08 LAB — CBC WITH DIFFERENTIAL/PLATELET
Abs Immature Granulocytes: 0.03 10*3/uL (ref 0.00–0.07)
Basophils Absolute: 0 10*3/uL (ref 0.0–0.1)
Basophils Relative: 1 %
Eosinophils Absolute: 0.2 10*3/uL (ref 0.0–0.5)
Eosinophils Relative: 2 %
HCT: 37.2 % (ref 36.0–46.0)
Hemoglobin: 12.3 g/dL (ref 12.0–15.0)
Immature Granulocytes: 0 %
Lymphocytes Relative: 28 %
Lymphs Abs: 2.2 10*3/uL (ref 0.7–4.0)
MCH: 28 pg (ref 26.0–34.0)
MCHC: 33.1 g/dL (ref 30.0–36.0)
MCV: 84.7 fL (ref 80.0–100.0)
Monocytes Absolute: 0.4 10*3/uL (ref 0.1–1.0)
Monocytes Relative: 5 %
Neutro Abs: 5.2 10*3/uL (ref 1.7–7.7)
Neutrophils Relative %: 64 %
Platelets: 220 10*3/uL (ref 150–400)
RBC: 4.39 MIL/uL (ref 3.87–5.11)
RDW: 12.4 % (ref 11.5–15.5)
WBC: 8 10*3/uL (ref 4.0–10.5)
nRBC: 0 % (ref 0.0–0.2)

## 2023-12-08 LAB — COMPREHENSIVE METABOLIC PANEL
ALT: 11 U/L (ref 0–44)
AST: 13 U/L — ABNORMAL LOW (ref 15–41)
Albumin: 4 g/dL (ref 3.5–5.0)
Alkaline Phosphatase: 69 U/L (ref 38–126)
Anion gap: 9 (ref 5–15)
BUN: 10 mg/dL (ref 6–20)
CO2: 23 mmol/L (ref 22–32)
Calcium: 9 mg/dL (ref 8.9–10.3)
Chloride: 103 mmol/L (ref 98–111)
Creatinine, Ser: 0.77 mg/dL (ref 0.44–1.00)
GFR, Estimated: >60 mL/min (ref >60)
Glucose, Bld: 104 mg/dL — ABNORMAL HIGH (ref 70–99)
Potassium: 3.8 mmol/L (ref 3.5–5.1)
Sodium: 135 mmol/L (ref 135–145)
Total Bilirubin: 0.8 mg/dL (ref 0.0–1.2)
Total Protein: 7.2 g/dL (ref 6.5–8.1)

## 2023-12-08 LAB — HCG, QUANTITATIVE, PREGNANCY: hCG, Beta Chain, Quant, S: 2421 m[IU]/mL — ABNORMAL HIGH (ref <5)

## 2023-12-08 MED ORDER — ACETAMINOPHEN 500 MG PO TABS
1000.0000 mg | ORAL_TABLET | Freq: Once | ORAL | Status: AC
  Administered 2023-12-08: 1000 mg via ORAL
  Filled 2023-12-08: qty 2

## 2023-12-08 NOTE — ED Provider Notes (Signed)
[facility] Provider Note    Event Date/Time   First MD Initiated Contact with Patient 12/08/23 0208     (approximate)   History   Abdominal Pain   HPI  Michelle Meadows is a 33 y.o. female   Past medical history of prior cholecystectomy and known right ectopic pregnancy managed by gynecology with methotrexate 3 days ago who presents to the emergency department with increased pain to the right lower quadrant, intermittent over the past 24 hours.  She has had some vaginal spotting that appears to have increased slightly over the last 1 day as well.    Independent Historian contributed to assessment above: Her partner at the bedside to corroborate information and past medical history as above  External Medical Documents Reviewed: Emergency department/gynecology evaluation on 13 January documenting developed      Physical Exam   Triage Vital Signs: ED Triage Vitals  Encounter Vitals Group     BP 12/07/23 2348 113/61     Systolic BP Percentile --      Diastolic BP Percentile --      Pulse Rate 12/07/23 2348 98     Resp 12/07/23 2348 18     Temp 12/07/23 2348 99.1 F (37.3 C)     Temp Source 12/07/23 2348 Oral     SpO2 12/07/23 2348 99 %     Weight 12/07/23 2350 149 lb (67.6 kg)     Height 12/07/23 2350 5\' 2"  (1.575 m)     Head Circumference --      Peak Flow --      Pain Score 12/07/23 2353 8     Pain Loc --      Pain Education --      Exclude from Growth Chart --     Most recent vital signs: Vitals:   12/07/23 2348 12/08/23 0425  BP: 113/61 102/60  Pulse: 98 74  Resp: 18 16  Temp: 99.1 F (37.3 C) 98.4 F (36.9 C)  SpO2: 99% 100%    General: Awake, no distress.  CV:  Good peripheral perfusion.  Resp:  Normal effort.  Abd:  No distention.  Other:  Right lower quadrant tenderness without rigidity or guarding, hemodynamically stable.  She shows me a pad that she has with scant dried blood, but she and her partner are deferring  direct pelvic examination at this time.   ED Results / Procedures / Treatments   Labs (all labs ordered are listed, but only abnormal results are displayed) Labs Reviewed  HCG, QUANTITATIVE, PREGNANCY - Abnormal; Notable for the following components:      Result Value   hCG, Beta Chain, Quant, S 2,421 (*)    All other components within normal limits  COMPREHENSIVE METABOLIC PANEL - Abnormal; Notable for the following components:   Glucose, Bld 104 (*)    AST 13 (*)    All other components within normal limits  CBC WITH DIFFERENTIAL/PLATELET     I ordered and reviewed the above labs they are notable for beta quantitative hCG 2400 from 2500 previously   RADIOLOGY I independently reviewed and interpreted pelvic ultrasound and see no definitive intrauterine pregnancy I also reviewed radiologist's formal read.   PROCEDURES:  Critical Care performed: No  Procedures   MEDICATIONS ORDERED IN ED: Medications  acetaminophen (TYLENOL) tablet 1,000 mg (1,000 mg Oral Given 12/08/23 0429)    External physician / consultants:  I spoke with Dr. Jennell Corner of gynecology regarding care plan for this patient.  IMPRESSION / MDM / ASSESSMENT AND PLAN / ED COURSE  I reviewed the triage vital signs and the nursing notes.                                Patient's presentation is most consistent with acute presentation with potential threat to life or bodily function.  Differential diagnosis includes, but is not limited to, ectopic pregnancy, ruptured ectopic pregnancy, considered but less likely appendicitis   MDM:     Known right sided ectopic pregnancy with methotrexate treatment 3 days ago with worsening right sided abdominal pain and increase in vaginal bleeding concern for ectopic pregnancy/ruptured ectopic pregnancy.  Fortunately patient is hemodynamically stable and the pain comes in waves and is currently asymptomatic at this time.  Her beta-hCG is unchanged and her  transvaginal ultrasound is unchanged and there is no significant free fluid noted on examination, these findings and my clinical exam were shared with Dr. Maisie Fus amount of gynecology.  He notes that the symptoms are not uncommon after methotrexate therapy and given her stability in the emergency department, plan will be for discharge home at this time and close follow-up later today with Dr. Thomasene Mohair in gynecology clinic.  She was given strict return precautions to come back to the emergency department should any of her symptoms worsen or change.       FINAL CLINICAL IMPRESSION(S) / ED DIAGNOSES   Final diagnoses:  Right tubal pregnancy without intrauterine pregnancy     Rx / DC Orders   ED Discharge Orders     None        Note:  This document was prepared using Dragon voice recognition software and may include unintentional dictation errors.    Pilar Jarvis, MD 12/08/23 [phone]

## 2023-12-08 NOTE — Discharge Instructions (Addendum)
Call Dr. Jean Rosenthal this morning for an appointment in office.  He will be expecting your call.  Thank you for choosing [place] for your health care today!  Please see your primary doctor this week for a follow up appointment.   If you have any new, worsening, or unexpected symptoms call your doctor right away or come back to the emergency department for reevaluation.  It was my pleasure to care for you today.   Daneil Dan Modesto Charon, MD
